# Patient Record
Sex: Female | Born: 1975
Health system: Southern US, Community
[De-identification: ages and names within clinical notes are randomized; demographics above are authoritative.]

## PROBLEM LIST (undated history)

## (undated) DIAGNOSIS — O24419 Gestational diabetes mellitus in pregnancy, unspecified control: Secondary | ICD-10-CM

## (undated) DIAGNOSIS — O1490 Unspecified pre-eclampsia, unspecified trimester: Secondary | ICD-10-CM

## (undated) DIAGNOSIS — R011 Cardiac murmur, unspecified: Secondary | ICD-10-CM

## (undated) HISTORY — DX: Gestational diabetes mellitus in pregnancy, unspecified control: O24.419

---

## 1999-10-14 ENCOUNTER — Other Ambulatory Visit: Admission: RE | Admit: 1999-10-14 | Discharge: 1999-10-14 | Payer: Self-pay | Admitting: Gynecology

## 1999-12-02 ENCOUNTER — Other Ambulatory Visit: Admission: RE | Admit: 1999-12-02 | Discharge: 1999-12-02 | Payer: Self-pay | Admitting: Gynecology

## 1999-12-02 ENCOUNTER — Encounter (INDEPENDENT_AMBULATORY_CARE_PROVIDER_SITE_OTHER): Payer: Self-pay

## 2000-03-14 ENCOUNTER — Other Ambulatory Visit: Admission: RE | Admit: 2000-03-14 | Discharge: 2000-03-14 | Payer: Self-pay | Admitting: Gynecology

## 2001-01-16 ENCOUNTER — Other Ambulatory Visit: Admission: RE | Admit: 2001-01-16 | Discharge: 2001-01-16 | Payer: Self-pay | Admitting: Gynecology

## 2001-08-28 ENCOUNTER — Other Ambulatory Visit: Admission: RE | Admit: 2001-08-28 | Discharge: 2001-08-28 | Payer: Self-pay | Admitting: Gynecology

## 2002-08-29 ENCOUNTER — Other Ambulatory Visit: Admission: RE | Admit: 2002-08-29 | Discharge: 2002-08-29 | Payer: Self-pay | Admitting: Gynecology

## 2004-05-21 ENCOUNTER — Other Ambulatory Visit: Admission: RE | Admit: 2004-05-21 | Discharge: 2004-05-21 | Payer: Self-pay | Admitting: Gynecology

## 2005-06-16 ENCOUNTER — Other Ambulatory Visit: Admission: RE | Admit: 2005-06-16 | Discharge: 2005-06-16 | Payer: Self-pay | Admitting: Gynecology

## 2006-04-08 ENCOUNTER — Inpatient Hospital Stay (HOSPITAL_COMMUNITY): Admission: AD | Admit: 2006-04-08 | Discharge: 2006-04-08 | Payer: Self-pay | Admitting: Obstetrics & Gynecology

## 2006-04-10 ENCOUNTER — Inpatient Hospital Stay (HOSPITAL_COMMUNITY): Admission: AD | Admit: 2006-04-10 | Discharge: 2006-04-15 | Payer: Self-pay | Admitting: Obstetrics & Gynecology

## 2008-12-29 ENCOUNTER — Emergency Department (HOSPITAL_COMMUNITY): Admission: EM | Admit: 2008-12-29 | Discharge: 2008-12-29 | Payer: Self-pay | Admitting: Family Medicine

## 2009-11-03 ENCOUNTER — Ambulatory Visit: Payer: Self-pay | Admitting: Family Medicine

## 2009-11-03 DIAGNOSIS — B309 Viral conjunctivitis, unspecified: Secondary | ICD-10-CM | POA: Insufficient documentation

## 2009-11-03 DIAGNOSIS — J069 Acute upper respiratory infection, unspecified: Secondary | ICD-10-CM | POA: Insufficient documentation

## 2010-05-20 ENCOUNTER — Ambulatory Visit: Payer: Self-pay | Admitting: Emergency Medicine

## 2010-09-09 NOTE — Assessment & Plan Note (Signed)
Summary: LEFT EYE POSSIBLE PINK EYE   Vital Signs:  Patient Profile:   35 Years Old Female CC:      possible pink eye in Left eye X 1 day Height:     68 inches Weight:      192 pounds O2 Sat:      99 % O2 treatment:    Room Air Temp:     98.0 degrees F oral Pulse rate:   75 / minute Resp:     16 per minute BP sitting:   115 / 73  (right arm) Cuff size:   regular  Pt. in pain?   yes    Location:   left eye    Type:       heaviness  Vitals Entered By: Lajean Saver RN (November 03, 2009 10:08 AM)                   Updated Prior Medication List: VITAMIN D 400 UNIT CAPS (CHOLECALCIFEROL) once daily  Current Allergies: ! * SEASONALHistory of Present Illness Chief Complaint: possible pink eye in Left eye X 1 day History of Present Illness: Subjective: Patient complains of mild URI symptoms that started 4 days ago.  Last night she developed mild redness and itching of left eye.  She felt slight irritation in right eye this morning. + mild sore throat + mild cough No pleuritic pain No wheezing + nasal congestion No post-nasal drainage No sinus pain/pressure No earache No hemoptysis No SOB No fever/chills No nausea No vomiting No abdominal pain No diarrhea No skin rashes No fatigue No myalgias No headache    REVIEW OF SYSTEMS Constitutional Symptoms      Denies fever, chills, night sweats, weight loss, weight gain, and fatigue.  Eyes       Complains of eye pain and eye drainage.      Denies change in vision, glasses, contact lenses, and eye surgery.      Comments: left eye Ear/Nose/Throat/Mouth       Denies hearing loss/aids, change in hearing, ear pain, ear discharge, dizziness, frequent runny nose, frequent nose bleeds, sinus problems, sore throat, hoarseness, and tooth pain or bleeding.  Respiratory       Denies dry cough, productive cough, wheezing, shortness of breath, asthma, bronchitis, and emphysema/COPD.  Cardiovascular       Denies murmurs, chest  pain, and tires easily with exhertion.    Gastrointestinal       Denies stomach pain, nausea/vomiting, diarrhea, constipation, blood in bowel movements, and indigestion. Genitourniary       Denies painful urination, kidney stones, and loss of urinary control. Neurological       Denies paralysis, seizures, and fainting/blackouts. Musculoskeletal       Denies muscle pain, joint pain, joint stiffness, decreased range of motion, redness, swelling, muscle weakness, and gout.  Skin       Denies bruising, unusual mles/lumps or sores, and hair/skin or nail changes.  Psych       Denies mood changes, temper/anger issues, anxiety/stress, speech problems, depression, and sleep problems.  Past History:  Past Medical History: Unremarkable  Past Surgical History: Caesarean section 2007  Family History: Mother- MI Father- Heart failure  Social History: Married Never Smoked Alcohol use-no Drug use-no Smoking Status:  never Drug Use:  no   Objective:  No acute distress  Eyes:  Pupils are equal, round, and reactive to light and accomdation.  Extraocular movement is intact.   Left conjunctivae minimally injected.  No  discharge.  No lid tenderness or swelling.  No photophobia. Ears:  Canals normal.  Tympanic membranes normal.   Nose:  Normal septum.  Normal turbinates, mildly congested.  No sinus tenderness present.  Pharynx:  Normal  Neck:  Supple.  No adenopathy is present.   Lungs:  Clear to auscultation.  Breath sounds are equal.  Heart:  Regular rate and rhythm without murmurs, rubs, or gallops.  Abdomen:  Nontender without masses or hepatosplenomegaly.  Bowel sounds are present.  No CVA or flank tenderness.  Skin:  No rash Assessment New Problems: CONJUNCTIVITIS, VIRAL, LEFT (ICD-077.99) URI (ICD-465.9)  VIRAL URI WITH VIRAL CONJUNCTIVITIS  Plan New Medications/Changes: SULFACETAMIDE SODIUM 10 % SOLN (SULFACETAMIDE SODIUM) 1 or 2 gtts in affected eye q2 to 3 hr  #10cc x 0,  11/03/2009, Donna Christen MD  New Orders: New Patient Level III [id number] Planning Comments:   Treat symptomatically for now:  Use cool lubricating eye drops several times daily ("Refresh Tears") Begin expectorant/decongestant, cough suppressant at night.  If eye becomes increasingly red/irritated and persists, add sulfacetamide eye drops. Follow-up with PCP if not improving one week.   The patient and/or caregiver has been counseled thoroughly with regard to medications prescribed including dosage, schedule, interactions, rationale for use, and possible side effects and they verbalize understanding.  Diagnoses and expected course of recovery discussed and will return if not improved as expected or if the condition worsens. Patient and/or caregiver verbalized understanding.  Prescriptions: SULFACETAMIDE SODIUM 10 % SOLN (SULFACETAMIDE SODIUM) 1 or 2 gtts in affected eye q2 to 3 hr  #10cc x 0   Entered and Authorized by:   Donna Christen MD   Signed by:   Donna Christen MD on 11/03/2009   Method used:   Print then Give to Patient   RxID:   [id number]   Patient Instructions: 1)  May use Mucinex D (guaifenesin with decongestant) twice daily for congestion. 2)  Increase fluid intake, rest. 3)  May use Afrin nasal spray (or generic oxymetazoline) twice daily for about 5 days.  Also recommend using saline nasal spray several times daily and/or saline nasal irrigation. 4)  May use Delsym at night for cough. 5)  Use cool Refresh Tears in eyes several times daily. 6)  If eye redness does not improve in about 5 days, begin antibiotic eye drops 7)  Followup with family doctor if not improving one week.

## 2010-09-09 NOTE — Letter (Signed)
Summary: Out of Work  [facility]  [address]   [place], [place] [address]   Phone: [phone]  Fax: [phone]    November 03, 2009   Employee:  [name]    To Whom It May Concern:   For Medical reasons, please excuse the above named employee from work today.    If you need additional information, please feel free to contact our office.         Sincerely,    Donna Christen MD

## 2010-09-09 NOTE — Assessment & Plan Note (Signed)
Summary: PINK EYE   Vital Signs:  Patient Profile:   35 Years Old Female CC:      ?conjuctivitis both eyes Height:     68 inches Weight:      193 pounds O2 Sat:      99 % O2 treatment:    Room Air Temp:     98.0 degrees F oral Pulse rate:   90 / minute Resp:     14 per minute BP sitting:   111 / 70  (left arm) Cuff size:   regular  Vitals Entered By: Lajean Saver RN (May 20, 2010 10:10 AM)                  Updated Prior Medication List: VITAMIN D 400 UNIT CAPS (CHOLECALCIFEROL) once daily FOLIC ACID 1 MG TABS (FOLIC ACID)  * MULTIVITAMIN once daily  Current Allergies (reviewed today): ! * SEASONALHistory of Present Illness Chief Complaint: ?conjuctivitis both eyes History of Present Illness: Pink eye R eye.  Her daughter was dx yesterday and started on Vigamox.  She woke up today with crusts, redness, irritation.  Normal vision.  No F/C.  No URI symptoms.  Not using any OTC meds.  She doesn't wear contacts or glasses. They are trying to get pregnant and wants to be careful with meds.  She is also a Engineer, civil (consulting) at [employer].  REVIEW OF SYSTEMS Constitutional Symptoms      Denies fever, chills, night sweats, weight loss, weight gain, and fatigue.  Eyes       Complains of eye pain and eye drainage.      Denies change in vision, glasses, contact lenses, and eye surgery.      Comments: x last night Ear/Nose/Throat/Mouth       Denies hearing loss/aids, change in hearing, ear pain, ear discharge, dizziness, frequent runny nose, frequent nose bleeds, sinus problems, sore throat, hoarseness, and tooth pain or bleeding.  Respiratory       Denies dry cough, productive cough, wheezing, shortness of breath, asthma, bronchitis, and emphysema/COPD.  Cardiovascular       Denies murmurs, chest pain, and tires easily with exhertion.    Gastrointestinal       Denies stomach pain, nausea/vomiting, diarrhea, constipation, blood in bowel movements, and indigestion. Genitourniary       Denies  painful urination, kidney stones, and loss of urinary control. Neurological       Denies paralysis, seizures, and fainting/blackouts. Musculoskeletal       Denies muscle pain, joint pain, joint stiffness, decreased range of motion, redness, swelling, muscle weakness, and gout.  Skin       Denies bruising, unusual mles/lumps or sores, and hair/skin or nail changes.  Psych       Denies mood changes, temper/anger issues, anxiety/stress, speech problems, depression, and sleep problems. Other Comments: Daughter diagnosed with conjuctivitis yesterday   Past History:  Past Medical History: Reviewed history from 11/03/2009 and no changes required. Unremarkable  Past Surgical History: Reviewed history from 11/03/2009 and no changes required. Caesarean section 2007  Family History: Reviewed history from 11/03/2009 and no changes required. Mother- MI Father- Heart failure  Social History: Reviewed history from 11/03/2009 and no changes required. Married Never Smoked Alcohol use-no Drug use-no Physical Exam General appearance: well developed, well nourished, no acute distress Ears: normal, no lesions or deformities Nasal: mucosa pink, nonedematous, no septal deviation, turbinates normal Chest/Lungs: no rales, wheezes, or rhonchi bilateral, breath sounds equal without effort Heart: regular rate and  rhythm,  no murmur R eye: mild crust, mild conj erythema, sclera normal L eye: normal Assessment New Problems: CONJUNCTIVITIS (ICD-372.30)   Patient Education: Patient and/or caregiver instructed in the following: rest, fluids, Ibuprofen prn.  Plan New Medications/Changes: TOBREX 0.3 % SOLN (TOBRAMYCIN SULFATE) 2 drops both eyes three times a day for 7 days  #1 bottle x 0, 05/20/2010, Hoyt Koch MD TOBREX 0.3 % SOLN (TOBRAMYCIN SULFATE) 3 drops R eye for 7 days  #1 bottle x 0, 05/20/2010, Hoyt Koch MD  New Orders: Est. Patient Level II [id number] Planning Comments:    Wash hands frequently, change pillowcases daily Use eyedrops (these are class B for pregnancy which is the best in terms of antibacterial eyedrops) If not getting better, f/u with PCP or ophthalmology   The patient and/or caregiver has been counseled thoroughly with regard to medications prescribed including dosage, schedule, interactions, rationale for use, and possible side effects and they verbalize understanding.  Diagnoses and expected course of recovery discussed and will return if not improved as expected or if the condition worsens. Patient and/or caregiver verbalized understanding.  Prescriptions: TOBREX 0.3 % SOLN (TOBRAMYCIN SULFATE) 2 drops both eyes three times a day for 7 days  #1 bottle x 0   Entered and Authorized by:   Hoyt Koch MD   Signed by:   Hoyt Koch MD on 05/20/2010   Method used:   Print then Give to Patient   RxID:   [id number] TOBREX 0.3 % SOLN (TOBRAMYCIN SULFATE) 3 drops R eye for 7 days  #1 bottle x 0   Entered and Authorized by:   Hoyt Koch MD   Signed by:   Hoyt Koch MD on 05/20/2010   Method used:   Print then Give to Patient   RxID:   [id number]   Orders Added: 1)  Est. Patient Level II [29528]

## 2010-11-24 ENCOUNTER — Ambulatory Visit (INDEPENDENT_AMBULATORY_CARE_PROVIDER_SITE_OTHER): Payer: 59 | Admitting: Cardiology

## 2010-11-24 ENCOUNTER — Encounter: Payer: Self-pay | Admitting: Cardiology

## 2010-11-24 DIAGNOSIS — R0600 Dyspnea, unspecified: Secondary | ICD-10-CM

## 2010-11-24 DIAGNOSIS — R011 Cardiac murmur, unspecified: Secondary | ICD-10-CM

## 2010-11-24 DIAGNOSIS — R002 Palpitations: Secondary | ICD-10-CM | POA: Insufficient documentation

## 2010-11-24 DIAGNOSIS — R0609 Other forms of dyspnea: Secondary | ICD-10-CM

## 2010-11-24 LAB — MAGNESIUM: Magnesium: 2 mg/dL (ref 1.5–2.5)

## 2010-11-24 LAB — BASIC METABOLIC PANEL
GFR: 158.25 mL/min (ref >60.00)
Glucose, Bld: 89 mg/dL (ref 70–99)
Potassium: 3.9 mEq/L (ref 3.5–5.1)
Sodium: 138 mEq/L (ref 135–145)

## 2010-11-24 NOTE — Assessment & Plan Note (Signed)
Most likely a flow murmur. Await echocardiogram.

## 2010-11-24 NOTE — Assessment & Plan Note (Signed)
Description most consistent with PVCs. They do not appear to be particularly bothersome at this point. If they worsen we will consider a CardioNet monitor. We could add a beta blocker in the future if needed but will avoid at present given the paucity of symptoms and ongoing pregnancy. Check potassium and magnesium. Echocardiogram to quantify LV function.

## 2010-11-24 NOTE — Progress Notes (Signed)
HPI:  35 yo female with no prior cardiac history for evaluation of palpitations and murmur. Note patient is [redacted] weeks pregnant. Over the past one week she has had occasional palpitations. They are described as a skip. They are not sustained. She also has some dyspnea on exertion which she attributes to her pregnancy. Question recent orthopnea. No PND. Pedal edema attributed to pregnancy. No chest pain or history of syncope. Patient also noted to have a murmur by her gynecologist. Cardiology is asked to further evaluate. Current Outpatient Prescriptions  Medication Sig Dispense Refill  . aspirin 81 MG tablet Take 81 mg by mouth daily.        . Cholecalciferol (VITAMIN D) 1000 UNITS capsule Take 1,000 Units by mouth daily.        . Prenatal MV-Min-Fe Fum-FA-DHA (PRENATAL 1 PO) Take by mouth. 1 tab po qd         No Known Allergies  No past medical history on file.  Past Surgical History  Procedure Date  . Cesarean section     History   Social History  . Marital Status: Married    Spouse Name: N/A    Number of Children: 1  . Years of Education: N/A   Occupational History  .  [employer]   Social History Main Topics  . Smoking status: Never Smoker   . Smokeless tobacco: Not on file  . Alcohol Use: Yes     Occasional; none while pregnant  . Drug Use: No  . Sexually Active: Not on file   Other Topics Concern  . Not on file   Social History Narrative  . No narrative on file    Family History  Problem Relation Age of Onset  . Sudden death Mother     Age 28; ? MI  . Coronary artery disease Father     Age 32    ROS: no fevers or chills, productive cough, hemoptysis, dysphasia, odynophagia, melena, hematochezia, dysuria, hematuria, rash, seizure activity, orthopnea, PND, pedal edema, claudication. Remaining systems are negative.  Physical Exam: General:  Well developed/well nourished in NAD Skin warm/dry Patient not depressed No peripheral  clubbing Back-normal HEENT-normal/normal eyelids Neck supple/normal carotid upstroke bilaterally; no bruits; no JVD; no thyromegaly chest - CTA/ normal expansion CV - RRR/normal S1 and S2; no rubs or gallops;  PMI nondisplaced; 2/6 systolic ejection murmur Abdomen -NT/ND, no HSM, + bowel sounds, no bruit; [redacted] weeks pregnant 2+ femoral pulses, no bruits Ext-1+ edema, no chords, 2+ DP Neuro-grossly nonfocal  ECG NSR, Normal axis, no ST changes.

## 2010-11-24 NOTE — Assessment & Plan Note (Signed)
Most likely related to pregnancy. Echocardiogram to quantify LV function.

## 2010-11-24 NOTE — Patient Instructions (Addendum)
Your physician has requested that you have an echocardiogram. Echocardiography is a painless test that uses sound waves to create images of your heart. It provides your doctor with information about the size and shape of your heart and how well your heart's chambers and valves are working. This procedure takes approximately one hour. There are no restrictions for this procedure. Friday 11-26-10 @ 8:30AM   Your physician recommends that you return for lab work in: TODAY

## 2010-11-26 ENCOUNTER — Ambulatory Visit (HOSPITAL_COMMUNITY): Payer: 59 | Attending: Cardiology | Admitting: Radiology

## 2010-11-26 DIAGNOSIS — I2510 Atherosclerotic heart disease of native coronary artery without angina pectoris: Secondary | ICD-10-CM | POA: Insufficient documentation

## 2010-11-26 DIAGNOSIS — R002 Palpitations: Secondary | ICD-10-CM | POA: Insufficient documentation

## 2010-11-26 DIAGNOSIS — R011 Cardiac murmur, unspecified: Secondary | ICD-10-CM | POA: Insufficient documentation

## 2010-11-26 DIAGNOSIS — I079 Rheumatic tricuspid valve disease, unspecified: Secondary | ICD-10-CM | POA: Insufficient documentation

## 2010-11-26 DIAGNOSIS — R0609 Other forms of dyspnea: Secondary | ICD-10-CM | POA: Insufficient documentation

## 2010-11-26 DIAGNOSIS — R0600 Dyspnea, unspecified: Secondary | ICD-10-CM

## 2010-11-26 DIAGNOSIS — I059 Rheumatic mitral valve disease, unspecified: Secondary | ICD-10-CM | POA: Insufficient documentation

## 2010-11-26 DIAGNOSIS — R0989 Other specified symptoms and signs involving the circulatory and respiratory systems: Secondary | ICD-10-CM | POA: Insufficient documentation

## 2010-12-01 ENCOUNTER — Telehealth: Payer: Self-pay | Admitting: Cardiology

## 2010-12-01 NOTE — Telephone Encounter (Signed)
Pt returning your call re echo test results.

## 2010-12-01 NOTE — Telephone Encounter (Signed)
Spoke with pt, she is aware of echo results [name]  

## 2010-12-19 ENCOUNTER — Inpatient Hospital Stay (HOSPITAL_COMMUNITY)
Admission: AD | Admit: 2010-12-19 | Discharge: 2010-12-19 | Disposition: A | Discharge status: Home / Self Care | Payer: 59 | Source: Ambulatory Visit | Attending: Obstetrics & Gynecology | Admitting: Obstetrics & Gynecology

## 2010-12-19 DIAGNOSIS — O368190 Decreased fetal movements, unspecified trimester, not applicable or unspecified: Secondary | ICD-10-CM | POA: Insufficient documentation

## 2010-12-19 LAB — URINALYSIS, ROUTINE W REFLEX MICROSCOPIC
Hgb urine dipstick: NEGATIVE
Nitrite: NEGATIVE
Specific Gravity, Urine: 1.015 (ref 1.005–1.030)
Urobilinogen, UA: 0.2 mg/dL (ref 0.0–1.0)

## 2010-12-24 NOTE — Op Note (Signed)
Julie [name], [name]                 ACCOUNT NO.:  [id number]   MEDICAL RECORD NO.:  [id number]          PATIENT TYPE:  INP   LOCATION:  9111                          FACILITY:  [facility]   PHYSICIAN:  Richardean Sale, M.D.   DATE OF BIRTH:  1976-07-06   DATE OF PROCEDURE:  04/12/2006  DATE OF DISCHARGE:                                 OPERATIVE REPORT   PREOPERATIVE DIAGNOSES:  1. 38 week pregnancy with mild preeclampsia for induction.  2. Arrest of dilation.   POSTOPERATIVE DIAGNOSES:  1. 38 week pregnancy with mild preeclampsia for induction.  2. Arrest of dilation.   PROCEDURE:  Primary low transverse cesarean section.   SURGEON:  Dr. Richardean Sale.   ASSISTANT:  None.   COMPLICATIONS:  None.   ANESTHESIA:  Epidural.   SPECIMENS:  Placenta to pathology.   OPERATIVE FINDINGS:  Viable female infant in the cephalic presentation,  right occiput transverse nuchal cord x1, Apgar 9 and 9, arterial cord pH  7.31.  Normal-appearing adnexa. Estimated birth weight 8 pounds 9 ounces.   INDICATIONS:  This is a 30-year, gravida 1, para 0, white female who is  admitted at [redacted] weeks gestation for induction of labor due to preeclampsia.  The patient had 1 gram of protein and a 24-hour urine specimen and has had  labile blood pressures in the office. The patient underwent induction of  labor with Cervidil followed by amniotomy and administration of Pitocin.  The patient was on a maximum dose Pitocin and reached maximal dilation of 4  cm which was unchanged over 5 hours. The patient's labor which initially  showed contractions every 2-3 minutes ultimately spaced out with  contractions only every 5 minutes despite administration of Pitocin. Given  the arrest of dilation, the patient was counseled on recommendation to  proceed with primary cesarean delivery.  Prior to proceeding, the risks,  benefits and alternatives of the procedure discussed with the patient in  detail.  We discussed the risks  which include but are not limited to  hemorrhage requiring transfusion, infection, injury to the bowel, the  bladder or other organs which could require additional surgery.  The risks  of deep vein thrombosis and anesthesia related complications.  The patient  voiced an understanding of the above and desires to proceed. Informed  consent was obtained before proceeding to the OR.   DESCRIPTION OF PROCEDURE:  The patient was taken to the operating room where  her epidural was dosed and was tested and was adequate.  She was then  prepped and draped in the usual sterile fashion with Betadine.  A Foley  catheter had already been placed. A Pfannenstiel skin incision was made with  the scalpel.  Prior to making the incision, 10 mL of 0.5% plain Marcaine  were injected in the area where the incision was to be made. The incision  was then carried down to the fascia, the fascia was incised in the midline  and the fascial incision was then extended laterally with the Mayo scissors.  The superior and inferior aspects of the fascial incision were  then grasped  with Kocher clamps, elevated and the underlying rectus muscles dissected off  with both sharp and blunt dissection.  The muscles was then opened in the  midline.  The peritoneum was identified and entered bluntly.  The peritoneal  incision was then extended superiorly and inferiorly with good visualization  of the bladder.  The bladder blade was then inserted and the vesicouterine  peritoneum was grasped with pickups and entered sharply with Metzenbaum  scissors.  This incision was then extended laterally and a bladder flap was  created digitally.   The bladder blade was then reinserted and the lower uterine segment was  incised in a transverse fashion with a scalpel.  This incision was then  extended manually, a hand was then placed in the incision and infant's head  was delivered atraumatically.  Nuchal cord x1 was reduced and nose and mouth   were suctioned with the bulb. The infant was then delivered to the sterile  field.  The cord was clamped and cut and the infant handed off to waiting  NICU attendant.  Cord gas and cord blood was obtained.  Arterial cord pH was  7.31.  Apgar's were 9 and 9. The placenta was then removed, the uterus was  cleared of all clot and debris and the placenta was sent to labor and  delivery. A moderate amount of bleeding was controlled with intravenous  Pitocin and bimanual massage. The uterus incision was then closed with a  running locked chromic suture.  A second layer of same suture was placed in  an imbricating fashion for additional hemostasis and in the event this  patient would attempt at vaginal delivery in the future. Once the uterine  incision was hemostatic, the adnexa were visualized and were normal.  The  peritoneal surfaces, bladder flap and muscle surfaces were all inspected and  any areas of bleeding were cauterized with the Bovie until hemostasis was  assured.  The fascia was then closed with a running Vicryl suture and  subcutaneous space was irrigated.  Any areas of bleeding were cauterized  with the Bovie and the skin was then closed with staples.   The patient tolerated the procedure very well.  All sponge, lap, needle and  instrument counts were correct x2.  She was taken to the recovery room awake  in stable condition and there were no complications.      Richardean Sale, M.D.  Electronically Signed     JW/MEDQ  D:  04/12/2006  T:  04/12/2006  Job:  [id number]

## 2010-12-24 NOTE — Discharge Summary (Signed)
[name], [name]                 ACCOUNT NO.:  [id number]   MEDICAL RECORD NO.:  [id number]          PATIENT TYPE:  INP   LOCATION:  9111                          FACILITY:  [facility]   PHYSICIAN:  Richardean Sale, M.D.   DATE OF BIRTH:  03/21/1976   DATE OF ADMISSION:  04/10/2006  DATE OF DISCHARGE:  04/15/2006                                 DISCHARGE SUMMARY   ADMISSION DIAGNOSIS:  Intrauterine pregnancy at 38 weeks with mild  preeclampsia for induction of labor.   DISCHARGE DIAGNOSES:  1. Intrauterine pregnancy at 38 weeks with mild preeclampsia for induction      of labor.  2. Arrest of dilation.  3. Status post cesarean section delivery.   OPERATION/PROCEDURE:  Primary low transverse cesarean delivery performed on  April 12, 2006, resulting in delivery of a viable infant with Apgars of 8  and 9, intact placenta with three-vessel cord, normal adnexa, and normal  uterus.   HOSPITAL COURSE AND HISTORY OF PRESENT ILLNESS:  Please see admission  history and physical for details.  Briefly, this is a 35 year old, gravida  1, para 0 white female who is at [redacted] weeks gestation with mild preeclampsia  based on proteinuria of 1 g on a 24-hour urine specimen within the last week  and mild elevated blood pressures in the 130s/80s.  The patient denied any  headache, visual changes or epigastric pain and her preeclampsia labs have  been within normal limits.  The patient presented for cervical ripening on  April 10, 2006, underwent ripening with Cervidil followed by  administration of Pitocin and amniotomy.  The patient reached maximum  dilation of 4 cm despite intravenous Pitocin and subsequently underwent a  primary cesarean delivery for arrest of dilation.   The patient's postoperative course was unremarkable.  She remains afebrile  throughout her hospitalizations. Her vital signs remained stable and her  blood pressures remained in the 130s/80s.  She denied any headache, visual  change or epigastric pain.  On postoperative day #3, she was ambulating well  without difficulty.  Was tolerating regular diet.  Her pain was controlled  with oral pain medication.  She was subsequent discharged to home on  postoperative day #3 in good condition.   DISPOSITION:  To home.   CONDITION ON DISCHARGE:  Improved.   FOLLOW UP:  The patient will follow up in four weeks for routine  postoperative visit.   LABORATORY DATA:  Postoperative hemoglobin 10.8, hematocrit 30.7, platelets  185, white count 12.  Day of admission AST 20, ALT 11, creatinine 0.8, LDH  155, uric acid 5.9.   MEDICATIONS:  1. Percocet one to two tablets p.o. q.4-6h. p.r.n. pain.  2. Motrin 600 mg p.o. q.6 h. p.r.n.   DISCHARGE INSTRUCTIONS:  The patient is to call for any headache, visual  changes or epigastric pain, any pain not relieved with her pain medication,  vomiting, heavy vaginal bleeding, redness or drainage from incision or any  temperature greater than 0.4.      Richardean Sale, M.D.  Electronically Signed     JW/MEDQ  D:  04/16/2006  T:  04/17/2006  Job:  [id number]

## 2011-01-31 ENCOUNTER — Encounter (HOSPITAL_COMMUNITY)
Admission: RE | Admit: 2011-01-31 | Discharge: 2011-01-31 | Disposition: A | Discharge status: Home / Self Care | Payer: 59 | Source: Ambulatory Visit | Attending: Obstetrics | Admitting: Obstetrics

## 2011-01-31 LAB — CBC
Hemoglobin: 12.4 g/dL (ref 12.0–15.0)
MCH: 32 pg (ref 26.0–34.0)
MCV: 92.8 fL (ref 78.0–100.0)
RBC: 3.88 MIL/uL (ref 3.87–5.11)

## 2011-01-31 LAB — BASIC METABOLIC PANEL
CO2: 23 mEq/L (ref 19–32)
Calcium: 9.1 mg/dL (ref 8.4–10.5)
Chloride: 103 mEq/L (ref 96–112)
Creatinine, Ser: 0.52 mg/dL (ref 0.50–1.10)
Glucose, Bld: 123 mg/dL — ABNORMAL HIGH (ref 70–99)
Sodium: 135 mEq/L (ref 135–145)

## 2011-01-31 LAB — SURGICAL PCR SCREEN: Staphylococcus aureus: NEGATIVE

## 2011-02-07 ENCOUNTER — Inpatient Hospital Stay (HOSPITAL_COMMUNITY)
Admission: RE | Admit: 2011-02-07 | Discharge: 2011-02-10 | DRG: 765 | Disposition: A | Discharge status: Home / Self Care | Payer: 59 | Source: Ambulatory Visit | Attending: Obstetrics | Admitting: Obstetrics

## 2011-02-07 ENCOUNTER — Other Ambulatory Visit: Payer: Self-pay | Admitting: Obstetrics

## 2011-02-07 DIAGNOSIS — Z01818 Encounter for other preprocedural examination: Secondary | ICD-10-CM

## 2011-02-07 DIAGNOSIS — O09529 Supervision of elderly multigravida, unspecified trimester: Secondary | ICD-10-CM | POA: Diagnosis present

## 2011-02-07 DIAGNOSIS — O34219 Maternal care for unspecified type scar from previous cesarean delivery: Secondary | ICD-10-CM | POA: Diagnosis present

## 2011-02-07 DIAGNOSIS — O3660X0 Maternal care for excessive fetal growth, unspecified trimester, not applicable or unspecified: Principal | ICD-10-CM | POA: Diagnosis present

## 2011-02-07 DIAGNOSIS — O99814 Abnormal glucose complicating childbirth: Secondary | ICD-10-CM | POA: Diagnosis present

## 2011-02-07 DIAGNOSIS — Z01812 Encounter for preprocedural laboratory examination: Secondary | ICD-10-CM

## 2011-02-07 DIAGNOSIS — O409XX0 Polyhydramnios, unspecified trimester, not applicable or unspecified: Secondary | ICD-10-CM | POA: Diagnosis present

## 2011-02-07 LAB — TYPE AND SCREEN: Antibody Screen: NEGATIVE

## 2011-02-07 LAB — GLUCOSE, CAPILLARY

## 2011-02-08 LAB — CBC
HCT: 30.2 % — ABNORMAL LOW (ref 36.0–46.0)
MCV: 93.2 fL (ref 78.0–100.0)
Platelets: 134 10*3/uL — ABNORMAL LOW (ref 150–400)
RBC: 3.24 MIL/uL — ABNORMAL LOW (ref 3.87–5.11)
WBC: 11.7 10*3/uL — ABNORMAL HIGH (ref 4.0–10.5)

## 2011-02-16 NOTE — Op Note (Signed)
[name], [name]                 ACCOUNT NO.:  [id number]  MEDICAL RECORD NO.:  [id number]  LOCATION:                                 FACILITY:  PHYSICIAN:  Lendon Colonel, MD   DATE OF BIRTH:  09-15-1975  DATE OF PROCEDURE:  02/07/2011 DATE OF DISCHARGE:                              OPERATIVE REPORT   PREOPERATIVE DIAGNOSES:  Macrosomia, gestational diabetes, polyhydramnios, prior cesarean section.  POSTOPERATIVE DIAGNOSES:  Macrosomia, gestational diabetes, polyhydramnios, prior cesarean section.  PROCEDURE:  Repeat cesarean section.  SURGEON:  Lendon Colonel, MD  ASSISTANT:  Ivonne Andrew, PA  ANESTHESIA:  Spinal.  FINDINGS:  Female infant in the DOA position, Apgar's 9 and 9, weight 12 pounds 3 ounces.  Cord pH 7.33.  Normal uterus, tubes, and ovaries. Large amount of amniotic fluid, clear, and intact placenta to Pathology and about 1 gram of Ancef.  ESTIMATED BLOOD LOSS:  1200 mL.  COMPLICATIONS:  None.  INDICATIONS:  This is a 34 year old multiparous patient with prior cesarean section and macrosomic baby who opted for elective repeat cesarean section at 39 weeks.  PROCEDURE:  After informed consent was obtained from the patient including risks, benefits of the surgical procedure increased risks of bleeding due to uterine distention from polyhydramnios and macrosomia and increased surgical risks due to gestational diabetes.  The patient was taken to the operating room where spinal anesthesia was initiated without difficulty.  She was prepped and draped in normal sterile fashion in the dorsal spine position with leftward tilt.  A Pfannenstiel skin incision was made 2 cm above the pubic symphysis in the midline, carried through to the underlying layer of fascia with the Bovie cautery.  The fascia was incised in midline.  The incision was extended laterally with the Mayo scissors.  The inferior aspect of the fascial incision was grasped with Kocher clamps,  elevated up, and the underlying rectus muscle dissected off sharply.  The superior aspect of the fascial incision was grasped with Kocher clamps, elevated up, and the underlying rectus muscles dissected off sharply.  The midline was identified and the peritoneum was scarred to the underside of fascia.  It was entered and cautious dissection was done to remove the peritoneal scar from the fascial incision.  The bladder was identified and peritoneal incision extended laterally to keep adequate room and avoid extension into the bladder.  The bladder blade was inserted.  The peritoneum was identified, grasped with pickups, entered sharply with the Metzenbaum scissors.  The incision was extended laterally and the bladder flap was created digitally.  Bladder blade was reinserted.  The lower uterine segment was incised in a transverse fashion with scalpel, extended superiorly and laterally.  The amniotic sac was then ruptured. Instruments removed.  The vertex was rotated, elevated, brought to the incision, and delivered with fundal pressure.  No nuchal cord was noted. The nose and mouth were bulb suctioned.  The remainder of the infant delivered without complications.  The cord was clamped and cut.  Infant was handed off to awaiting pediatrician.  Cord gas and cord blood were obtained.  The placenta was expressed.  The uterus was cleared of all  clots and debris.  The bladder blade was reinserted.  The edge of the incision were tagged.  The uterine incision was closed with 0 Vicryl in a running lock fashion.  Several additional figure-of-eight sutures were placed along the right angle of the incision to obtain hemostasis.  The second layer of 0 Vicryl was used in imbricating fashion for hemostasis and several additional figure-of-eight sutures were placed along the incision.  Good hemostasis was noted.  The tubes and ovaries were identified and appeared normal.  The incision was reinspected, found  to be hemostatic.  The pelvis was irrigated with warm normal saline.  The peritoneum was closed with 2-0 Vicryl after the tagged edges were cut on the side of the fascia and underside of the muscles were inspected, found to be hemostatic.  The fascia was closed with 0 Vicryl in a running fashion in 2 halves.  Scarpa's layer was closed with 2-0 plain gut and the skin was closed with staples.  The patient tolerated the procedure well.  Sponge, lap, and needle counts were correct x3 and the patient was taken to the recovery room in stable condition.     Lendon Colonel, MD     KAF/MEDQ  D:  02/07/2011  T:  02/08/2011  Job:  [id number]  Electronically Signed by Noland Fordyce MD on 02/16/2011 11:26:34 PM

## 2011-02-16 NOTE — Discharge Summary (Signed)
NAMEVIVIENNE, [name]                 ACCOUNT NO.:  [id number]  MEDICAL RECORD NO.:  [id number]  LOCATION:  9133                          FACILITY:  [facility]  PHYSICIAN:  Lendon Colonel, MD   DATE OF BIRTH:  07/04/1976  DATE OF ADMISSION:  02/07/2011 DATE OF DISCHARGE:  02/10/2011                              DISCHARGE SUMMARY   ADMITTING DIAGNOSES:  Intrauterine pregnancy at term for repeat cesarean section, macrosomia.  DISCHARGE DIAGNOSIS:  Postoperative day #3, stable, dependent edema.  HISTORY:  The patient is a gravida 2, para 1-0-0-1 at 30 weeks' gestation with [facility] February 14, 2011.  Prenatal care was obtained at [facility] since 8 weeks and 2 days' gestation with Dr. Ernestina [name] as primary.  PRENATAL LABORATORY DATA:  O positive, rubella immune, GBS negative, HIV negative, RPR nonreactive, hepatitis B negative, and 1-hour GTT 130.  Prenatal course was complicated by macrosomia, history of preeclampsia at 37 weeks with induction of labor in the first pregnancy, maintained on baby aspirin until 36 weeks during this pregnancy; prior cesarean section; macrosomia; gestational diabetic with initially passed glucose tolerance test, but given size greater than dates persistent and blood sugars checked periodically, the patient was found to have elevated fasting glucose and was started on Lantus.  CURRENT MEDICATIONS:  Prenatal vitamins and Lantus insulin 15 units at bedtime.  No known drug allergies.  The patient presented for scheduled cesarean section on February 07, 2011.  ADMISSION LABORATORY DATA:  CBC which showed WBCs 11.2, hemoglobin 12.4, hematocrit 36.0, and platelets of 152.  MRSA by PCR was negative and RPR negative.  Blood glucose was 123.  SURGERY:  The patient underwent repeat cesarean section on February 07, 2011, for previous cesarean section and macrosomia.  The patient delivered a female infant with a weight of 12 pounds and 3 ounces and Apgars of 9 and 9.  Newborn was transferred to  Regular Nursery.  Please see operative report for further details.  Postoperative course was complicated by dependent edema, otherwise stable.  POSTOPERATIVE LABORATORY DATA:  WBCs 11.7, hemoglobin 10.4, hematocrit 30.2, and platelets 134.  Blood glucose repeated on postop day #2 was 77 and 88.  The patient's vital signs remained stable.  She was afebrile throughout hospital stay.  Physical exam was within normal limits.  Wound was well approximated with staples which were removed prior to discharge and replaced with Steri-Strips.  Newborn is being breastfed and underwent circumcision during hospital stay.  DISCHARGE CARE:  The patient was discharged home on postop day #3 in stable condition.  DIET:  Regular.  ACTIVITY:  Ad lib with postop weight lifting restrictions x2 weeks and instructions per [facility] booklet.  DISCHARGE MEDICATIONS: 1. Prenatal vitamins 1 tablet p.o. daily. 2. Ibuprofen 800 mg p.o. q.8 h. 3. Tylox 1-2 tablets p.o. q.4-6 h. p.r.n. 4. Hydrochlorothiazide 12.5 p.o. q.a.m. p.r.n. for dependent edema.  The patient is to follow up in 6 weeks for postpartum visit.    ______________________________ Arlan Organ, CNM   ______________________________ Lendon Colonel, MD    DP/MEDQ  D:  02/10/2011  T:  02/11/2011  Job:  [id number]  Electronically Signed by Arlan Organ CNM on 02/13/2011 01:33:12  AM Electronically Signed by Noland Fordyce MD on 02/16/2011 11:29:58 PM

## 2011-04-22 ENCOUNTER — Other Ambulatory Visit: Payer: Self-pay | Admitting: Obstetrics

## 2011-04-23 ENCOUNTER — Encounter (HOSPITAL_COMMUNITY)
Admission: RE | Disposition: A | Discharge status: Home / Self Care | Payer: Self-pay | Source: Ambulatory Visit | Attending: Obstetrics

## 2011-04-23 ENCOUNTER — Ambulatory Visit (HOSPITAL_COMMUNITY): Payer: 59

## 2011-04-23 ENCOUNTER — Encounter (HOSPITAL_COMMUNITY): Payer: Self-pay | Admitting: Anesthesiology

## 2011-04-23 ENCOUNTER — Ambulatory Visit (HOSPITAL_COMMUNITY)
Admission: RE | Admit: 2011-04-23 | Discharge: 2011-04-23 | Disposition: A | Discharge status: Home / Self Care | Payer: 59 | Source: Ambulatory Visit | Attending: Obstetrics | Admitting: Obstetrics

## 2011-04-23 ENCOUNTER — Other Ambulatory Visit: Payer: Self-pay | Admitting: Obstetrics

## 2011-04-23 ENCOUNTER — Encounter (HOSPITAL_COMMUNITY): Payer: Self-pay

## 2011-04-23 ENCOUNTER — Ambulatory Visit (HOSPITAL_COMMUNITY): Payer: 59 | Admitting: Anesthesiology

## 2011-04-23 HISTORY — PX: DILATION AND EVACUATION: SHX1459

## 2011-04-23 HISTORY — DX: Cardiac murmur, unspecified: R01.1

## 2011-04-23 LAB — CBC
Hemoglobin: 13.7 g/dL (ref 12.0–15.0)
MCH: 28.5 pg (ref 26.0–34.0)
RBC: 4.8 MIL/uL (ref 3.87–5.11)

## 2011-04-23 LAB — TYPE AND SCREEN
ABO/RH(D): O POS
Antibody Screen: NEGATIVE

## 2011-04-23 SURGERY — DILATION AND EVACUATION, UTERUS
Anesthesia: Monitor Anesthesia Care | Site: Uterus | Wound class: Clean Contaminated

## 2011-04-23 MED ORDER — GENTAMICIN SULFATE 40 MG/ML IJ SOLN
140.0000 mg | Freq: Once | INTRAMUSCULAR | Status: DC
  Filled 2011-04-23: qty 3.5

## 2011-04-23 MED ORDER — PROPOFOL 10 MG/ML IV EMUL
INTRAVENOUS | Status: DC | PRN
  Administered 2011-04-23 (×6): 40 mg via INTRAVENOUS
  Administered 2011-04-23: 20 mg via INTRAVENOUS

## 2011-04-23 MED ORDER — KETOROLAC TROMETHAMINE 30 MG/ML IJ SOLN
INTRAMUSCULAR | Status: AC
  Filled 2011-04-23: qty 1

## 2011-04-23 MED ORDER — ONDANSETRON HCL 4 MG/2ML IJ SOLN
INTRAMUSCULAR | Status: AC
  Filled 2011-04-23: qty 2

## 2011-04-23 MED ORDER — LIDOCAINE HCL 1 % IJ SOLN
INTRAMUSCULAR | Status: DC | PRN
  Administered 2011-04-23: 20 mL

## 2011-04-23 MED ORDER — CLINDAMYCIN PHOSPHATE 900 MG/50ML IV SOLN
900.0000 mg | Freq: Once | INTRAVENOUS | Status: DC
  Filled 2011-04-23: qty 50

## 2011-04-23 MED ORDER — KETOROLAC TROMETHAMINE 30 MG/ML IJ SOLN
INTRAMUSCULAR | Status: DC | PRN
  Administered 2011-04-23: 30 mg via INTRAVENOUS

## 2011-04-23 MED ORDER — PROPOFOL 10 MG/ML IV EMUL
INTRAVENOUS | Status: AC
  Filled 2011-04-23: qty 20

## 2011-04-23 MED ORDER — ONDANSETRON HCL 4 MG/2ML IJ SOLN
INTRAMUSCULAR | Status: DC | PRN
  Administered 2011-04-23: 4 mg via INTRAVENOUS

## 2011-04-23 MED ORDER — PROPOFOL 10 MG/ML IV EMUL
INTRAVENOUS | Status: AC
  Filled 2011-04-23: qty 50

## 2011-04-23 MED ORDER — CLINDAMYCIN PHOSPHATE 600 MG/50ML IV SOLN
INTRAVENOUS | Status: DC | PRN
  Administered 2011-04-23: 900 mg via INTRAVENOUS

## 2011-04-23 MED ORDER — ACETAMINOPHEN 10 MG/ML IV SOLN
1000.0000 mg | Freq: Four times a day (QID) | INTRAVENOUS | Status: DC | PRN
  Administered 2011-04-23 (×2): 1000 mg via INTRAVENOUS
  Filled 2011-04-23: qty 100

## 2011-04-23 MED ORDER — LACTATED RINGERS IV SOLN
INTRAVENOUS | Status: DC
  Administered 2011-04-23: 10:00:00 via INTRAVENOUS
  Administered 2011-04-23: 125 mL/h via INTRAVENOUS

## 2011-04-23 MED ORDER — GENTAMICIN IN SALINE 1.6-0.9 MG/ML-% IV SOLN
INTRAVENOUS | Status: DC | PRN
  Administered 2011-04-23: 140 mg via INTRAVENOUS

## 2011-04-23 MED ORDER — LIDOCAINE HCL (CARDIAC) 20 MG/ML IV SOLN
INTRAVENOUS | Status: AC
  Filled 2011-04-23: qty 5

## 2011-04-23 SURGICAL SUPPLY — 16 items
CATH ROBINSON RED A/P 16FR (CATHETERS) ×2 IMPLANT
CLOTH BEACON ORANGE TIMEOUT ST (SAFETY) ×2 IMPLANT
DECANTER SPIKE VIAL GLASS SM (MISCELLANEOUS) ×2 IMPLANT
GLOVE BIO SURGEON STRL SZ 6.5 (GLOVE) ×2 IMPLANT
GLOVE BIOGEL PI IND STRL 7.0 (GLOVE) ×2 IMPLANT
GLOVE BIOGEL PI INDICATOR 7.0 (GLOVE) ×2
KIT BERKELEY 1ST TRIMESTER 3/8 (MISCELLANEOUS) ×2 IMPLANT
NDL SPNL 22GX3.5 QUINCKE BK (NEEDLE) ×1 IMPLANT
NEEDLE SPNL 22GX3.5 QUINCKE BK (NEEDLE) ×2 IMPLANT
NS IRRIG 1000ML POUR BTL (IV SOLUTION) ×2 IMPLANT
PACK VAGINAL MINOR WOMEN LF (CUSTOM PROCEDURE TRAY) ×2 IMPLANT
PAD PREP 24X48 CUFFED NSTRL (MISCELLANEOUS) ×2 IMPLANT
SET BERKELEY SUCTION TUBING (SUCTIONS) ×2 IMPLANT
SYR CONTROL 10ML LL (SYRINGE) ×2 IMPLANT
TOWEL OR 17X24 6PK STRL BLUE (TOWEL DISPOSABLE) ×4 IMPLANT
VACURETTE 7MM CVD STRL WRAP (CANNULA) ×1 IMPLANT

## 2011-04-23 NOTE — Op Note (Signed)
Indication: Retained POC is History of present illness: Space space 35 year old 2 weeks postpartum with evidence of retained POC's on ultrasound and complaints of bleeding and foul odor to vaginal discharge  Procedure: Space after informed consent the patient was taken to the operating room where IV sedation was initiated without difficulty. She was prepped and draped in the normal sterile fashion with Betadine prep. No catheterization was done as the patient voided immediately preop. A bimanual examination was done to assess the size and the position of the uterus. A sterile speculum was inserted into the vagina, a single-tooth tenaculum was used to grasp the anterior lip of the cervix. 30 cc of 1% lidocaine were infused both at 12:00 into the cervix and at 5 and 7:00 in the cervical paracervical junction. Using the tenaculum to straighten the cervix serial dilation was done with Shawnie Pons dilators while watching the ultrasound screen serial  dilation was done to a [employer].  a 7 French suction curet was inserted just past the internal os and on suction removal of POC's. ultrasound guidance was used throughout the process. several passes were made to remove all the contents in the uterus. A gentle sharp curettage was done to remove a small portion of placental tissue in the right posterior aspect of the uterus. Ultrasound confirmed a 5 mm stripe with no evidence of blood flow at the end of the procedure. Silver nitrate and pressure refused placed on the tenaculum site to control bleeding. The patient tolerated the procedure well sponge lap and needle counts were correct the patient was taken to the recovery room in stable condition.  Zalan Shidler A. 04/23/2011 10:51 AM

## 2011-04-23 NOTE — Anesthesia Postprocedure Evaluation (Signed)
Anesthesia Post Note  Patient: [name]  Procedure(s) Performed:  DILATATION AND EVACUATION (D&E) - Ultrasound Guidance  Anesthesia type: MAC  Patient location: PACU  Post pain: Pain level controlled  Post assessment: Post-op Vital signs reviewed  Last Vitals:  Filed Vitals:   04/23/11 1100  BP: 113/65  Pulse: 73  Temp: 98.2 F (36.8 C)  Resp: 18    Post vital signs: Reviewed  Level of consciousness: sedated  Complications: No apparent anesthesia complications

## 2011-04-23 NOTE — H&P (Signed)
CC: retained POCs  HPI: 35 yo 2 months PP from [facility] for macrosomia in setting of insulin dependant GDM. Pt presented to office w/ c/o foul odor to vaginal d/c and heavy vaginal bleeding. Bleeding has slowed prior to eval and u/s done revealed retained POC's. Pt given Clindamycin oral for possible endometritis and gien 2d methergine to help evacuated POCs. Pt now c/o continued foul smelling d/c (though improved since on abx) and no significant passage of tissue. Repeat u/s this am reveals continued small amount of retained POCs and pt consented to [facility]. Pt notes mild abd pain, no fevers. Pt aware risks of adhesions, perforation, bleeding and infection.  Past Medical History  Diagnosis Date  . Heart murmur     during pregnancy  GDM, PP had elevated FBS but no evidence persistant DM Borderline bps  Meds: Clinda All: none  PE:  Filed Vitals:   04/23/11 0840  BP: 141/85  Pulse: 76  Temp: 99 F (37.2 C)  TempSrc: Oral  Resp: 18  Weight: 90.266 kg (199 lb)  SpO2: 100%   Gen: well appearing Abd: obese,  NT GU: cvx closed, no CMT, uterus not particularly enlarged, difficult to palpate due to obesity LE: NT, no edema  CBC    Component Value Date/Time   WBC 6.9 04/23/2011 0835   RBC 4.80 04/23/2011 0835   HGB 13.7 04/23/2011 0835   HCT 41.1 04/23/2011 0835   PLT 229 04/23/2011 0835   MCV 85.6 04/23/2011 0835   MCH 28.5 04/23/2011 0835   MCHC 33.3 04/23/2011 0835   RDW 13.4 04/23/2011 0835    A/P: 2 months PP, no IC since delivery, w/ thickened EMS with flow c/w retained POCs. Given foul odor, 2 months PP and no resolution w/ methergine, will move to  OR for u/s guided D&C. - gent/ Clinda prophylaxis  Samel Bruna A. 04/23/2011 10:12 AM

## 2011-04-23 NOTE — Anesthesia Preprocedure Evaluation (Addendum)
Anesthesia Evaluation  Name, MR# and DOB Patient awake  General Assessment Comment  Reviewed: Allergy & Precautions, H&P , Patient's Chart, lab work & pertinent test results, reviewed documented beta blocker date and time   History of Anesthesia Complications Negative for: history of anesthetic complications  Airway Mallampati: II TM Distance: >3 FB Neck ROM: full    Dental No notable dental hx.    Pulmonary (+) shortness of breath   clear to auscultation  pulmonary exam normalPulmonary Exam Normal breath sounds clear to auscultation none    Cardiovascular Exercise Tolerance: Good - Valvular Problems/Murmursregular Normal    Neuro/Psych Negative Neurological ROS  Negative Psych ROS  GI/Hepatic/Renal negative GI ROS  negative Liver ROS  negative Renal ROS        Endo/Other  Negative Endocrine ROS (+)      Abdominal   Musculoskeletal   Hematology negative hematology ROS (+)   Peds  Reproductive/Obstetrics negative OB ROS    Anesthesia Other Findings            Anesthesia Physical Anesthesia Plan  ASA: I  Anesthesia Plan: MAC   Post-op Pain Management:    Induction:   Airway Management Planned:   Additional Equipment:   Intra-op Plan:   Post-operative Plan:   Informed Consent: I have reviewed the patients History and Physical, chart, labs and discussed the procedure including the risks, benefits and alternatives for the proposed anesthesia with the patient or authorized representative who has indicated his/her understanding and acceptance.   Dental Advisory Given  Plan Discussed with: CRNA and Surgeon  Anesthesia Plan Comments: (Pt prefers to avoid narcotics.  IV tylenol ordered)        Anesthesia Quick Evaluation

## 2011-04-23 NOTE — Transfer of Care (Signed)
Immediate Anesthesia Transfer of Care Note  Patient: [name]  Procedure(s) Performed:  DILATATION AND EVACUATION (D&E) - Ultrasound Guidance  Patient Location: PACU  Anesthesia Type: MAC  Level of Consciousness: awake, alert  and oriented  Airway & Oxygen Therapy: Patient Spontanous Breathing  Post-op Assessment: Report given to PACU RN, Post -op Vital signs reviewed and stable and Patient moving all extremities X 4  Post vital signs: Reviewed and stable  Complications: No apparent anesthesia complications

## 2011-04-23 NOTE — Brief Op Note (Signed)
04/23/2011  10:44 AM  PATIENT:  [name]  35 y.o. female  PRE-OPERATIVE DIAGNOSIS:  Retained Placenta  POST-OPERATIVE DIAGNOSIS:  Retained Placenta  PROCEDURE:  Procedure(s): DILATATION AND EVACUATION (D&E)  SURGEON:  Surgeon(s): Tresa Endo A. Tenleigh Byer  PHYSICIAN ASSISTANT: none  ASSISTANTS: none   ANESTHESIA:   local and IV sedation  OR FLUID I/O:  Total I/O In: 1000 [I.V.:1000] Out: 25 [Blood:25]  BLOOD ADMINISTERED:none  DRAINS: none   LOCAL MEDICATIONS USED:  LIDOCAINE 30 CC  SPECIMEN:  Source of Specimen:  retained POCs  DISPOSITION OF SPECIMEN:  PATHOLOGY  COUNTS:  YES  TOURNIQUET:  * No tourniquets in log *  DICTATION: .Note written in EPIC  PLAN OF CARE: Discharge to home after PACU  PATIENT DISPOSITION:  PACU - hemodynamically stable.   Delay start of Pharmacological VTE agent (>24hrs) due to surgical blood loss or risk of bleeding:  yes

## 2011-04-25 ENCOUNTER — Encounter (HOSPITAL_COMMUNITY): Payer: Self-pay | Admitting: Obstetrics

## 2011-11-04 ENCOUNTER — Emergency Department
Admission: EM | Admit: 2011-11-04 | Discharge: 2011-11-04 | Disposition: A | Discharge status: Home / Self Care | Payer: 59 | Source: Home / Self Care | Attending: Emergency Medicine | Admitting: Emergency Medicine

## 2011-11-04 DIAGNOSIS — H109 Unspecified conjunctivitis: Secondary | ICD-10-CM

## 2011-11-04 MED ORDER — POLYMYXIN B-TRIMETHOPRIM 10000-0.1 UNIT/ML-% OP SOLN: 1.0000 [drp] | Freq: Four times a day (QID) | OPHTHALMIC | Status: AC

## 2011-11-04 NOTE — ED Provider Notes (Signed)
History     CSN: [id number]  Arrival date & time 11/04/11  1220   First MD Initiated Contact with Patient 11/04/11 1227      Chief Complaint  Patient presents with  . Conjunctivitis    (Consider location/radiation/quality/duration/timing/severity/associated sxs/prior treatment) HPI [name] presents today with an EYE COMPLAINT  Location: right  Onset: 1  Days   Symptoms: Redness: yes Discharge: yes Pain: no Photophobia: no Decreased Vision: no URI symptoms: no Itching/Allergy sxs: no Glaucoma: no Recent eye surgery: no Contact lens use: no  Red Flags Trauma: no Foreign Body: no Vomiting/HA: no Halos around lights: no Chickenpox or zoster: no     Past Medical History  Diagnosis Date  . Heart murmur     during pregnancy    Past Surgical History  Procedure Date  . Cesarean section   . Dilation and evacuation 04/23/2011    Procedure: DILATATION AND EVACUATION (D&E);  Surgeon: Alphonsus Sias. Fogleman;  Location: [facility];  Service: Gynecology;  Laterality: N/A;  Ultrasound Guidance    Family History  Problem Relation Age of Onset  . Sudden death Mother     Age 79; ? MI  . Coronary artery disease Father     Age 56    History  Substance Use Topics  . Smoking status: Never Smoker   . Smokeless tobacco: Not on file  . Alcohol Use: Yes     Occasional; none while pregnant    OB History    Grav Para Term Preterm Abortions TAB SAB Ect Mult Living                  Review of Systems  All other systems reviewed and are negative.    Allergies  Review of patient's allergies indicates no known allergies.  Home Medications   Current Outpatient Rx  Name Route Sig Dispense Refill  . ASPIRIN 81 MG PO TABS Oral Take 81 mg by mouth daily.      [name] VITAMIN D 1000 UNITS PO CAPS Oral Take 1,000 Units by mouth daily.     . CHOLECALCIFEROL 400 UNITS PO TABS Oral Take 400 Units by mouth daily.      [name] CLINDAMYCIN HCL 300 MG PO CAPS Oral Take 300 mg by mouth 3 (three)  times daily.      . IBUPROFEN 200 MG PO TABS Oral Take 400 mg by mouth daily as needed. pain     . PRENATAL 1 PO Oral Take by mouth. 1 tab po qd     . POLYMYXIN B-TRIMETHOPRIM 10000-0.1 UNIT/ML-% OP SOLN Both Eyes Place 1 drop into both eyes every 6 (six) hours. 10 mL 0    BP 110/73  Pulse 71  Temp(Src) 98.8 F (37.1 C) (Oral)  Resp 18  Ht 5\' 8"  (1.727 m)  Wt 210 lb (95.255 kg)  BMI 31.93 kg/m2  SpO2 97%  Physical Exam  Nursing note and vitals reviewed. Constitutional: She is oriented to person, place, and time. She appears well-developed and well-nourished.  HENT:  Head: Normocephalic and atraumatic.  Right Ear: Tympanic membrane, external ear and ear canal normal.  Left Ear: Tympanic membrane, external ear and ear canal normal.  Nose: Nose normal.  Mouth/Throat: Oropharynx is clear and moist.  Eyes: Right eye exhibits no chemosis, no discharge and no exudate. Left eye exhibits no chemosis, no discharge and no exudate. Right conjunctiva is injected (mild). Left conjunctiva is not injected. No scleral icterus.  Neck: Neck supple.  Cardiovascular: Regular rhythm and  normal heart sounds.   Pulmonary/Chest: Effort normal and breath sounds normal. No respiratory distress.  Neurological: She is alert and oriented to person, place, and time.  Skin: Skin is warm and dry.  Psychiatric: She has a normal mood and affect. Her speech is normal.    ED Course  Procedures (including critical care time)  Labs Reviewed - No data to display No results found.   1. Conjunctivitis       MDM   Patient symptoms consistent with conjunctivitis right eye.  Given her a prescription for Polytrim eyedrops.  She is contagious and I've advised hand hygiene especially when around her children.  Throw out her recent eye makeup.  Followup PCP or ophthalmology if not improving.        Marlaine Hind, MD 11/04/11 1240

## 2011-11-04 NOTE — ED Notes (Signed)
Rt eye, itchy, red, burns x 1 day

## 2012-10-17 ENCOUNTER — Ambulatory Visit: Payer: 59 | Admitting: Physician Assistant

## 2012-10-19 ENCOUNTER — Ambulatory Visit (INDEPENDENT_AMBULATORY_CARE_PROVIDER_SITE_OTHER): Payer: 59 | Admitting: Physician Assistant

## 2012-10-19 ENCOUNTER — Encounter: Payer: Self-pay | Admitting: Physician Assistant

## 2012-10-19 ENCOUNTER — Other Ambulatory Visit: Payer: Self-pay | Admitting: Physician Assistant

## 2012-10-19 VITALS — BP 124/76 | HR 77 | Ht 68.0 in | Wt 214.0 lb

## 2012-10-19 DIAGNOSIS — N6459 Other signs and symptoms in breast: Secondary | ICD-10-CM

## 2012-10-19 DIAGNOSIS — Z131 Encounter for screening for diabetes mellitus: Secondary | ICD-10-CM

## 2012-10-19 DIAGNOSIS — L570 Actinic keratosis: Secondary | ICD-10-CM

## 2012-10-19 DIAGNOSIS — R5381 Other malaise: Secondary | ICD-10-CM

## 2012-10-19 DIAGNOSIS — L821 Other seborrheic keratosis: Secondary | ICD-10-CM

## 2012-10-19 DIAGNOSIS — Z83438 Family history of other disorder of lipoprotein metabolism and other lipidemia: Secondary | ICD-10-CM

## 2012-10-19 DIAGNOSIS — N6452 Nipple discharge: Secondary | ICD-10-CM

## 2012-10-19 DIAGNOSIS — Z13220 Encounter for screening for lipoid disorders: Secondary | ICD-10-CM

## 2012-10-19 DIAGNOSIS — R5383 Other fatigue: Secondary | ICD-10-CM

## 2012-10-19 DIAGNOSIS — Z833 Family history of diabetes mellitus: Secondary | ICD-10-CM

## 2012-10-19 NOTE — Patient Instructions (Addendum)
Will refer to dermatology.  Will call with labs.

## 2012-10-21 NOTE — Progress Notes (Addendum)
  Subjective:    Patient ID: [name], female    DOB: [birth year], 37 y.o.   MRN: [id number]  HPI Patient is a 37 yo female who presents to the clinic to establish care. PMH positive for Gestational diabetes, Gestastional heart murmur. She is not on any current medications. Last Pap 2012. Last fasting labs 2012. Family hx of stroke, heart attack, hyperlipidemia. Social, surgical hx reviewed.   Pt has had bilateral white discharge coming from both breast. She denies any pain or palpable lumps. She finished breast feeding in July 2013. Discharge is not daily but off and on. Her breast feel full like they are full of milk. Periods are regular and not painful. Does not use OCP. She is sexually active.she does have appt with OBGYN next week and would like work up there.    Review of Systems  All other systems reviewed and are negative.       Objective:   Physical Exam  Constitutional: She is oriented to person, place, and time. She appears well-developed and well-nourished.  HENT:  Head: Normocephalic and atraumatic.  Eyes: Conjunctivae are normal.  Neck: Normal range of motion. Neck supple. No thyromegaly present.  Cardiovascular: Normal rate, regular rhythm and normal heart sounds.   Did not hear murmur today.  Pulmonary/Chest: Effort normal and breath sounds normal.  Lymphadenopathy:    She has no cervical adenopathy.  Neurological: She is alert and oriented to person, place, and time.  Skin: Skin is warm and dry.  1mm scaly patch with erythematous base on right shoulder anteriorly.   Multiple brown waxy scratch off appearance papules on lower left calf.   Psychiatric: She has a normal mood and affect. Her behavior is normal.          Assessment & Plan:  Bilateral nipple discharge- discussed with pt needed pregnancy test. Pt left without giving sample. Likely will do at [facility] office. Will check TSH. Will leave prolactin level for OB to check. Close to 40 would like to  consider mamogram. Advised her to bring up with obgyn. This could be a normal variant it is not uncommon to have some clostrum from breast after having children.   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   Need screening labs especially with family hx- Ordered will call with results. Would like vitamin d tested since takes daily.

## 2012-10-22 NOTE — Addendum Note (Signed)
Addended by: Jomarie Longs on: 10/22/2012 12:20 AM   Modules accepted: Orders, Level of Service

## 2012-10-24 LAB — CBC
HCT: 39.9 % (ref 36.0–46.0)
MCHC: 35.1 g/dL (ref 30.0–36.0)
MCV: 84.4 fL (ref 78.0–100.0)
Platelets: 240 10*3/uL (ref 150–400)
RDW: 13.6 % (ref 11.5–15.5)
WBC: 7.8 10*3/uL (ref 4.0–10.5)

## 2012-10-24 LAB — LIPID PANEL
HDL: 42 mg/dL (ref >39)
LDL Cholesterol: 114 mg/dL — ABNORMAL HIGH (ref 0–99)
Triglycerides: 58 mg/dL (ref <150)
VLDL: 12 mg/dL (ref 0–40)

## 2012-10-24 LAB — COMPREHENSIVE METABOLIC PANEL
AST: 10 U/L (ref 0–37)
Albumin: 4.1 g/dL (ref 3.5–5.2)
Alkaline Phosphatase: 55 U/L (ref 39–117)
Glucose, Bld: 110 mg/dL — ABNORMAL HIGH (ref 70–99)
Potassium: 4.2 mEq/L (ref 3.5–5.3)
Sodium: 140 mEq/L (ref 135–145)
Total Bilirubin: 0.5 mg/dL (ref 0.3–1.2)
Total Protein: 6.4 g/dL (ref 6.0–8.3)

## 2012-10-24 LAB — TSH: TSH: 2.586 u[IU]/mL (ref 0.350–4.500)

## 2012-10-26 LAB — HM PAP SMEAR: HM PAP: NEGATIVE

## 2012-11-19 ENCOUNTER — Emergency Department (INDEPENDENT_AMBULATORY_CARE_PROVIDER_SITE_OTHER): Payer: 59

## 2012-11-19 ENCOUNTER — Emergency Department
Admission: EM | Admit: 2012-11-19 | Discharge: 2012-11-19 | Disposition: A | Discharge status: Home / Self Care | Payer: 59 | Source: Home / Self Care | Attending: Family Medicine | Admitting: Family Medicine

## 2012-11-19 ENCOUNTER — Encounter: Payer: Self-pay | Admitting: *Deleted

## 2012-11-19 ENCOUNTER — Telehealth: Payer: Self-pay | Admitting: *Deleted

## 2012-11-19 DIAGNOSIS — M940 Chondrocostal junction syndrome [Tietze]: Secondary | ICD-10-CM

## 2012-11-19 DIAGNOSIS — R0789 Other chest pain: Secondary | ICD-10-CM

## 2012-11-19 DIAGNOSIS — R079 Chest pain, unspecified: Secondary | ICD-10-CM

## 2012-11-19 HISTORY — DX: Unspecified pre-eclampsia, unspecified trimester: O14.90

## 2012-11-19 LAB — CBC WITH DIFFERENTIAL/PLATELET
Basophils Absolute: 0 10*3/uL (ref 0.0–0.1)
Basophils Relative: 0 % (ref 0–1)
Eosinophils Absolute: 0.2 10*3/uL (ref 0.0–0.7)
Eosinophils Relative: 2 % (ref 0–5)
HCT: 40.2 % (ref 36.0–46.0)
MCH: 29.6 pg (ref 26.0–34.0)
MCHC: 35.1 g/dL (ref 30.0–36.0)
MCV: 84.3 fL (ref 78.0–100.0)
Monocytes Absolute: 0.8 10*3/uL (ref 0.1–1.0)
Platelets: 246 10*3/uL (ref 150–400)
RDW: 13.8 % (ref 11.5–15.5)

## 2012-11-19 MED ORDER — KETOROLAC TROMETHAMINE 30 MG/ML IJ SOLN
30.0000 mg | Freq: Once | INTRAMUSCULAR | Status: AC
  Administered 2012-11-19: 30 mg via INTRAMUSCULAR

## 2012-11-19 NOTE — ED Provider Notes (Addendum)
History     CSN: [id number]  Arrival date & time 11/19/12  6063   First MD Initiated Contact with Patient 11/19/12 [phone]      Chief Complaint  Patient presents with  . Chest Pain   HPI Patient is SAB chief complaint of chest pain x3 days. Patient states that she initially noticed pain while she was at work. Pain is predominantly in the center of her chest without radiation. No nausea vomiting or diaphoresis associated with symptoms. Pain seems to be worse in the patient extends her shoulders. Patient is unsure she's had any strenuous activity. Patient does state she has a very heavy 4-month-old malaise over 30 pounds. Patient also works as a Engineer, civil (consulting). No alleviating factors. Pain is more of an ache that is constant. No prior history of hypertension, diabetes. Patient is a nonsmoker. Patient does report a significant family history of heart attack in her father who had an MI when he was 85 at the time.   Past Medical History  Diagnosis Date  . Heart murmur     during pregnancy  . Gestational diabetes   . Preeclampsia     Past Surgical History  Procedure Laterality Date  . Cesarean section    . Dilation and evacuation  04/23/2011    Procedure: DILATATION AND EVACUATION (D&E);  Surgeon: Alphonsus Sias. Fogleman;  Location: [facility];  Service: Gynecology;  Laterality: N/A;  Ultrasound Guidance    Family History  Problem Relation Age of Onset  . Sudden death Mother     Age 65; ? MI  . Coronary artery disease Father     Age 59  . Diabetes Father   . Hyperlipidemia Maternal Aunt     History  Substance Use Topics  . Smoking status: Never Smoker   . Smokeless tobacco: Never Used  . Alcohol Use: Yes     Comment: Occasional; none while pregnant    OB History   Grav Para Term Preterm Abortions TAB SAB Ect Mult Living                  Review of Systems  All other systems reviewed and are negative.    Allergies  Review of patient's allergies indicates no known  allergies.  Home Medications   Current Outpatient Rx  Name  Route  Sig  Dispense  Refill  . cholecalciferol (VITAMIN D) 400 UNITS TABS   Oral   Take 400 Units by mouth daily.             BP 135/79  Pulse 80  Temp(Src) 97.8 F (36.6 C) (Oral)  Resp 16  Ht 5\' 7"  (1.702 m)  Wt 213 lb (96.616 kg)  BMI 33.35 kg/m2  SpO2 98%  Physical Exam  Constitutional: She appears well-developed and well-nourished.  HENT:  Head: Normocephalic and atraumatic.  Eyes: Conjunctivae are normal. Pupils are equal, round, and reactive to light.  Neck: Normal range of motion.  Cardiovascular: Normal rate and regular rhythm.   Pulmonary/Chest: Effort normal.  Positive anterior chest wall tenderness palpation.  Abdominal: Soft.  Musculoskeletal: Normal range of motion.  Neurological: She is alert.  Skin: Skin is warm.    ED Course  Procedures (including critical care time)  Labs Reviewed  CBC WITH DIFFERENTIAL  TROPONIN I   Dg Chest 2 View  11/19/2012  *RADIOLOGY REPORT*  Clinical Data: Chest pain.  CHEST - 2 VIEW  Comparison: None.  Findings: Trachea is midline.  Heart size normal.  Lungs are clear.  No pleural fluid.  IMPRESSION: Negative.   Original Report Authenticated By: Leanna Battles, M.D.    EKG: NSR, no acute ST or T wave abnormalities.   1. Other chest pain   2. Costochondritis       MDM  Symptoms highly atypical for cardiac source. Most consistent with costochondritis. Had a relatively lengthy discussion with patient in terms of treatment options as well as followup. We'll check a CBC and troponins today at patient's request. Patient with one cardiovascular risk factor which is family history. Otherwise relatively healthy. EKG with normal sinus rhythm. Chest x-ray within normal limits. Patient given a shot of Toradol 30 mg IM x1. Will place patient on course of NSAIDs for acute treatment. Patient can discuss followup with a cardiologist with her regular doctor if  symptoms persist. Patient declined cardiology followup today. Discussed general, musculoskeletal, cardiac red flags. Follow up as needed.     The patient and/or caregiver has been counseled thoroughly with regard to treatment plan and/or medications prescribed including dosage, schedule, interactions, rationale for use, and possible side effects and they verbalize understanding. Diagnoses and expected course of recovery discussed and will return if not improved as expected or if the condition worsens. Patient and/or caregiver verbalized understanding.             Doree Albee, MD 11/19/12 1647  Doree Albee, MD 11/19/12 [phone]

## 2012-11-19 NOTE — ED Notes (Signed)
[name] c/o CP in center of her chest that started suddenly 3 days ago. Pain has since been intermittent and has lessened in severity. Denies nausea, diaphoresis, arm pain or numbness. Patient has strong family hx of MI and cardiac disease.

## 2012-11-20 ENCOUNTER — Telehealth: Payer: Self-pay | Admitting: Family Medicine

## 2012-11-20 NOTE — ED Notes (Signed)
Called and discussed results of lab work.  Symptomatically improving from costo.  Follow up with PCP  Doree Albee, MD 11/20/12 [phone]

## 2012-11-22 ENCOUNTER — Telehealth: Payer: Self-pay | Admitting: Emergency Medicine

## 2013-05-28 ENCOUNTER — Encounter: Payer: Self-pay | Admitting: Emergency Medicine

## 2013-05-28 ENCOUNTER — Emergency Department
Admission: EM | Admit: 2013-05-28 | Discharge: 2013-05-28 | Disposition: A | Discharge status: Home / Self Care | Payer: 59 | Source: Home / Self Care | Attending: Family Medicine | Admitting: Family Medicine

## 2013-05-28 DIAGNOSIS — R300 Dysuria: Secondary | ICD-10-CM

## 2013-05-28 DIAGNOSIS — R319 Hematuria, unspecified: Secondary | ICD-10-CM

## 2013-05-28 DIAGNOSIS — R0600 Dyspnea, unspecified: Secondary | ICD-10-CM

## 2013-05-28 DIAGNOSIS — R0609 Other forms of dyspnea: Secondary | ICD-10-CM

## 2013-05-28 LAB — POCT URINALYSIS DIP (MANUAL ENTRY)
Bilirubin, UA: NEGATIVE
Glucose, UA: NEGATIVE
Ketones, POC UA: NEGATIVE
Nitrite, UA: NEGATIVE
Spec Grav, UA: 1.02 (ref 1.005–1.03)
Urobilinogen, UA: 1 (ref 0–1)
pH, UA: 7 (ref 5–8)

## 2013-05-28 MED ORDER — NITROFURANTOIN MONOHYD MACRO 100 MG PO CAPS: 100.0000 mg | ORAL_CAPSULE | Freq: Two times a day (BID) | ORAL | Status: DC

## 2013-05-28 MED ORDER — PHENAZOPYRIDINE HCL 200 MG PO TABS: 200.0000 mg | ORAL_TABLET | Freq: Three times a day (TID) | ORAL | Status: DC

## 2013-05-28 NOTE — ED Notes (Signed)
[name] c/o left flank pain yesterday followed by dysuria and blood clots in her urine suddenly last night. Denies fever. No OTC meds taken.

## 2013-05-28 NOTE — ED Provider Notes (Signed)
CSN: [id number]     Arrival date & time 05/28/13  0818 History   First MD Initiated Contact with Patient 05/28/13 [phone]     Chief Complaint  Patient presents with  . Dysuria      HPI Comments: Patient awoke with left flank pain yesterday that lasted about an hour before resolving.  Yesterday about 10pm she developed a sensation of urinary urgency and pressure, and noted hematuria with some small blood clots present in urine.  She had several episodes of nocturia last night.  This morning she still has mild urgency but no hematuria.  No nausea/vomiting.  No fevers, chills, and sweats.  Patient's last menstrual period was 05/10/2013.   No history of kidney stones. Family History:  Father had kidney stones.  Patient is a 37 y.o. female presenting with dysuria. The history is provided by the patient.  Dysuria Pain quality:  Burning Pain severity:  Mild Onset quality:  Sudden Duration:  1 day Timing:  Intermittent Progression:  Improving Chronicity:  New Recent urinary tract infections: no   Relieved by:  Nothing Worsened by:  Nothing tried Ineffective treatments:  None tried Urinary symptoms: frequent urination and hematuria   Urinary symptoms: no discolored urine, no foul-smelling urine, no hesitancy and no bladder incontinence   Associated symptoms: flank pain   Associated symptoms: no abdominal pain, no fever, no genital lesions, no nausea, no vaginal discharge and no vomiting   Risk factors: no hx of pyelonephritis, no hx of urolithiasis, not pregnant, no recurrent urinary tract infections and no renal disease     Past Medical History  Diagnosis Date  . Heart murmur     during pregnancy  . Gestational diabetes   . Preeclampsia    Past Surgical History  Procedure Laterality Date  . Cesarean section    . Dilation and evacuation  04/23/2011    Procedure: DILATATION AND EVACUATION (D&E);  Surgeon: Alphonsus Sias. Fogleman;  Location: [facility];  Service: Gynecology;  Laterality: N/A;   Ultrasound Guidance   Family History  Problem Relation Age of Onset  . Sudden death Mother     Age 41; ? MI  . Coronary artery disease Father     Age 75  . Diabetes Father   . Hyperlipidemia Maternal Aunt    History  Substance Use Topics  . Smoking status: Never Smoker   . Smokeless tobacco: Never Used  . Alcohol Use: Yes     Comment: Occasional; none while pregnant   OB History   Grav Para Term Preterm Abortions TAB SAB Ect Mult Living                 Review of Systems  Constitutional: Negative for fever.  Gastrointestinal: Negative for nausea, vomiting and abdominal pain.  Genitourinary: Positive for dysuria and flank pain. Negative for vaginal discharge.  All other systems reviewed and are negative.    Allergies  Review of patient's allergies indicates no known allergies.  Home Medications   Current Outpatient Rx  Name  Route  Sig  Dispense  Refill  . cholecalciferol (VITAMIN D) 400 UNITS TABS   Oral   Take 400 Units by mouth daily.           . nitrofurantoin, macrocrystal-monohydrate, (MACROBID) 100 MG capsule   Oral   Take 1 capsule (100 mg total) by mouth 2 (two) times daily.   14 capsule   0   . phenazopyridine (PYRIDIUM) 200 MG tablet   Oral   Take  1 tablet (200 mg total) by mouth 3 (three) times daily. Take with food.   6 tablet   0    BP 125/83  Pulse 86  Temp(Src) 98.3 F (36.8 C) (Oral)  Resp 16  Wt 212 lb (96.163 kg)  BMI 33.2 kg/m2  SpO2 95%  LMP 05/10/2013 Physical Exam Nursing notes and Vital Signs reviewed. Appearance:  Patient appears stated age, and in no acute distress.  Patient is obese (BMI 33.2) Eyes:  Pupils are equal, round, and reactive to light and accomodation.  Extraocular movement is intact.   Pharynx:  Normal Neck:  Supple.   Lungs:  Clear to auscultation.  Breath sounds are equal.  Heart:  Regular rate and rhythm without murmurs, rubs, or gallops.  Abdomen:   Mild suprapubic tenderness without masses or  hepatosplenomegaly.  Bowel sounds are present.  No CVA or flank tenderness.  Extremities:  No edema.   Skin:  No rash present.   ED Course  Procedures  none    Labs Reviewed  URINE CULTURE pending  POCT URINALYSIS DIP (MANUAL ENTRY) BLO Large; PRO 100mg /dL; LEU small         MDM       1. Dysuria  2. Hematuria;  ?nephrolithiasis    Urine culture pending.  Begin Macrobid.  Rx given for Pyridium Continue increased fluid intake.  May take Ibuprofen 200mg , 4 tabs every 8 hours with food.  Followup with Family Doctor if not improved in about 5 days or if symptoms worsen. If symptoms become significantly worse during the night or over the weekend, proceed to the local emergency room.     Lattie Haw, MD 05/28/13 [phone]

## 2013-05-30 LAB — URINE CULTURE: Colony Count: >=100000

## 2013-05-31 ENCOUNTER — Telehealth: Payer: Self-pay | Admitting: *Deleted

## 2013-05-31 NOTE — Telephone Encounter (Signed)
Message copied by Edilia Bo on Fri May 31, 2013 12:08 PM ------      Message from: Donna Christen A      Created: Fri May 31, 2013 12:03 PM       Discontinue Macrobid. Switch to Cipro 500mg , one tab Q12HR for 7 days, Rx #14, no ref ------

## 2013-06-13 ENCOUNTER — Other Ambulatory Visit: Payer: Self-pay

## 2013-09-10 ENCOUNTER — Other Ambulatory Visit: Payer: Self-pay | Admitting: Dermatology

## 2013-10-01 ENCOUNTER — Encounter: Payer: Self-pay | Admitting: Emergency Medicine

## 2013-10-01 ENCOUNTER — Emergency Department
Admission: EM | Admit: 2013-10-01 | Discharge: 2013-10-01 | Disposition: A | Discharge status: Home / Self Care | Payer: 59 | Source: Home / Self Care | Attending: Emergency Medicine | Admitting: Emergency Medicine

## 2013-10-01 DIAGNOSIS — J0100 Acute maxillary sinusitis, unspecified: Secondary | ICD-10-CM

## 2013-10-01 MED ORDER — FLUTICASONE PROPIONATE 50 MCG/ACT NA SUSP: NASAL | Status: DC

## 2013-10-01 MED ORDER — AMOXICILLIN-POT CLAVULANATE 875-125 MG PO TABS: 1.0000 | ORAL_TABLET | Freq: Two times a day (BID) | ORAL | Status: DC

## 2013-10-01 NOTE — ED Provider Notes (Signed)
CSN: [id number]     Arrival date & time 10/01/13  1602 History   First MD Initiated Contact with Patient 10/01/13 1604     Chief Complaint  Patient presents with  . Cough  . Nasal Congestion  . Ear Fullness    HPI URI HISTORY  [name] is a 38 y.o. female who complains of onset of cold symptoms for 21 days.  Have been using over-the-counter treatment which helps a little bit.  She works as an Therapist, sports. She is 2 young children with URI symptoms  No chills/sweats +  Low-grade Fever  +  Nasal congestion +  Discolored yellow-green nasal discharge and Post-nasal drainage Positive sinus pain/pressure Mild sore throat  +  Minimal cough No wheezing No chest congestion No hemoptysis No shortness of breath No pleuritic pain  No itchy/red eyes No earache, but both ears feel full with mild pressure sensation  No nausea No vomiting No abdominal pain No diarrhea  No skin rashes +  Fatigue No myalgias No headache, except for sinus type headache without any focal neurologic symptoms No vision change   Past Medical History  Diagnosis Date  . Heart murmur     during pregnancy  . Gestational diabetes   . Preeclampsia    Past Surgical History  Procedure Laterality Date  . Cesarean section    . Dilation and evacuation  04/23/2011    Procedure: DILATATION AND EVACUATION (D&E);  Surgeon: Floyce Stakes. Fogleman;  Location: [facility];  Service: Gynecology;  Laterality: N/A;  Ultrasound Guidance   Family History  Problem Relation Age of Onset  . Sudden death Mother     Age 60; ? MI  . Coronary artery disease Father     Age 10  . Diabetes Father   . Hyperlipidemia Maternal Aunt    History  Substance Use Topics  . Smoking status: Never Smoker   . Smokeless tobacco: Never Used  . Alcohol Use: No   OB History   Grav Para Term Preterm Abortions TAB SAB Ect Mult Living                 Review of Systems  All other systems reviewed and are negative.      Allergies  Review of  patient's allergies indicates no known allergies.  Home Medications   Current Outpatient Rx  Name  Route  Sig  Dispense  Refill  . GARCINIA CAMBOGIA-CHROMIUM PO   Oral   Take by mouth.         Nyoka Cowden Tea, Camillia sinensis, (GREEN TEA PO)   Oral   Take by mouth.         . Linoleic Acid Conjugated (CLA PO)   Oral   Take by mouth.         [name] amoxicillin-clavulanate (AUGMENTIN) 875-125 MG per tablet   Oral   Take 1 tablet by mouth 2 (two) times daily. For 10 days. Take with food.   20 tablet   0   . cholecalciferol (VITAMIN D) 400 UNITS TABS   Oral   Take 400 Units by mouth daily.           . fluticasone (FLONASE) 50 MCG/ACT nasal spray      1 or 2 sprays each nostril twice a day   16 g   0    BP 123/76  Pulse 78  Temp(Src) 98 F (36.7 C) (Oral)  Resp 18  Wt 207 lb 12 oz (94.235 kg)  SpO2 96% Physical  Exam  Nursing note and vitals reviewed. Constitutional: She is oriented to person, place, and time. She appears well-developed and well-nourished.  Non-toxic appearance. She appears ill. No distress.  HENT:  Head: Normocephalic and atraumatic.  Right Ear: External ear and ear canal normal. No drainage. Tympanic membrane is injected. Tympanic membrane is not perforated and not erythematous. A middle ear effusion is present. No decreased hearing is noted.  Left Ear: External ear and ear canal normal. No drainage. Tympanic membrane is injected. Tympanic membrane is not perforated and not erythematous. A middle ear effusion is present. No decreased hearing is noted.  Nose: Mucosal edema and rhinorrhea present. Right sinus exhibits maxillary sinus tenderness. Left sinus exhibits maxillary sinus tenderness.  Mouth/Throat: Oropharynx is clear and moist. No oral lesions. No oropharyngeal exudate.  Eyes: Right eye exhibits no discharge. Left eye exhibits no discharge. No scleral icterus.  Neck: Neck supple.  Cardiovascular: Normal rate, regular rhythm and normal heart  sounds.   Pulmonary/Chest: Effort normal and breath sounds normal. No respiratory distress. She has no wheezes. She has no rales.  Lymphadenopathy:    She has no cervical adenopathy.  Neurological: She is alert and oriented to person, place, and time.  Skin: Skin is warm and dry.   no rash  ED Course  Procedures (including critical care time) Labs Review Labs Reviewed - No data to display Imaging Review No results found.    MDM   Final diagnoses:  Acute maxillary sinusitis    Treatment options discussed, as well as risks, benefits, alternatives. Patient voiced understanding and agreement with the following plans: Augmentin 875 twice a day x10 days Flonase twice a day Mucinex D and other symptomatic care discussed Follow-up with your primary care doctor in 5-7 days if not improving, or sooner if symptoms become worse. Precautions discussed. Red flags discussed. Questions invited and answered. Patient voiced understanding and agreement.     Jacqulyn Cane, MD 10/01/13 [phone]

## 2013-10-01 NOTE — ED Notes (Signed)
Pt c/o chest congestion, nasal congestion, and bilateral ear fullness x 3 wks, worse x 1 day.

## 2013-12-06 HISTORY — PX: OTHER SURGICAL HISTORY: SHX169

## 2014-06-18 ENCOUNTER — Encounter: Payer: Self-pay | Admitting: Physician Assistant

## 2014-06-18 ENCOUNTER — Ambulatory Visit (INDEPENDENT_AMBULATORY_CARE_PROVIDER_SITE_OTHER): Payer: 59 | Admitting: Physician Assistant

## 2014-06-18 VITALS — BP 105/62 | HR 74 | Ht 67.0 in | Wt 201.0 lb

## 2014-06-18 DIAGNOSIS — Z0000 Encounter for general adult medical examination without abnormal findings: Secondary | ICD-10-CM

## 2014-06-18 DIAGNOSIS — Z13220 Encounter for screening for lipoid disorders: Secondary | ICD-10-CM

## 2014-06-18 DIAGNOSIS — Z9889 Other specified postprocedural states: Secondary | ICD-10-CM | POA: Insufficient documentation

## 2014-06-18 DIAGNOSIS — Z131 Encounter for screening for diabetes mellitus: Secondary | ICD-10-CM

## 2014-06-18 DIAGNOSIS — R635 Abnormal weight gain: Secondary | ICD-10-CM

## 2014-06-18 DIAGNOSIS — Z1239 Encounter for other screening for malignant neoplasm of breast: Secondary | ICD-10-CM

## 2014-06-18 LAB — COMPLETE METABOLIC PANEL WITH GFR
ALBUMIN: 3.9 g/dL (ref 3.5–5.2)
ALK PHOS: 49 U/L (ref 39–117)
ALT: 12 U/L (ref 0–35)
AST: 13 U/L (ref 0–37)
BUN: 14 mg/dL (ref 6–23)
CO2: 27 mEq/L (ref 19–32)
CREATININE: 0.73 mg/dL (ref 0.50–1.10)
Calcium: 9.2 mg/dL (ref 8.4–10.5)
Chloride: 104 mEq/L (ref 96–112)
GFR, Est African American: >89 mL/min
GLUCOSE: 106 mg/dL — AB (ref 70–99)
POTASSIUM: 4.2 meq/L (ref 3.5–5.3)
Sodium: 139 mEq/L (ref 135–145)
Total Bilirubin: 0.9 mg/dL (ref 0.2–1.2)
Total Protein: 6.3 g/dL (ref 6.0–8.3)

## 2014-06-18 LAB — LIPID PANEL
Cholesterol: 156 mg/dL (ref 0–200)
HDL: 48 mg/dL (ref >39)
LDL Cholesterol: 96 mg/dL (ref 0–99)
Total CHOL/HDL Ratio: 3.3 Ratio
Triglycerides: 58 mg/dL (ref <150)
VLDL: 12 mg/dL (ref 0–40)

## 2014-06-18 LAB — TSH: TSH: 2.029 u[IU]/mL (ref 0.350–4.500)

## 2014-06-18 NOTE — Patient Instructions (Addendum)
contrave Qsymia   Keeping You Healthy  Get These Tests 1. Blood Pressure- Have your blood pressure checked once a year by your health care provider.  Normal blood pressure is 120/80. 2. Weight- Have your body mass index (BMI) calculated to screen for obesity.  BMI is measure of body fat based on height and weight.  You can also calculate your own BMI at GravelBags.it. 3. Cholesterol- Have your cholesterol checked every 5 years starting at age 40 then yearly starting at age 36. 39. Chlamydia, HIV, and other sexually transmitted diseases- Get screened every year until age 71, then within three months of each new sexual provider. 5. Pap Smear- Every 1-3 years; discuss with your health care provider. 6. Mammogram- Every year starting at age 62  Take these medicines  Calcium with Vitamin D-Your body needs 1200 mg of Calcium each day and [phone] IU of Vitamin D daily.  Your body can only absorb 500 mg of Calcium at a time so Calcium must be taken in 2 or 3 divided doses throughout the day.  Multivitamin with folic acid- Once daily if it is possible for you to become pregnant.  Get these Immunizations  Gardasil-Series of three doses; prevents HPV related illness such as genital warts and cervical cancer.  Menactra-Single dose; prevents meningitis.  Tetanus shot- Every 10 years.  Flu shot-Every year.  Take these steps 1. Do not smoke-Your healthcare provider can help you quit.  For tips on how to quit go to www.smokefree.gov or call 1-800 QUITNOW. 2. Be physically active- Exercise 5 days a week for at least 30 minutes.  If you are not already physically active, start slow and gradually work up to 30 minutes of moderate physical activity.  Examples of moderate activity include walking briskly, dancing, swimming, bicycling, etc. 3. Breast Cancer- A self breast exam every month is important for early detection of breast cancer.  For more information and instruction on self breast  exams, ask your healthcare provider or https://www.patel.info/. 4. Eat a healthy diet- Eat a variety of healthy foods such as fruits, vegetables, whole grains, low fat milk, low fat cheeses, yogurt, lean meats, poultry and fish, beans, nuts, tofu, etc.  For more information go to www. Thenutritionsource.org 5. Drink alcohol in moderation- Limit alcohol intake to one drink or less per day. Never drink and drive. 6. Depression- Your emotional health is as important as your physical health.  If you're feeling down or losing interest in things you normally enjoy please talk to your healthcare provider about being screened for depression. 7. Dental visit- Brush and floss your teeth twice daily; visit your dentist twice a year. 8. Eye doctor- Get an eye exam at least every 2 years. 9. Helmet use- Always wear a helmet when riding a bicycle, motorcycle, rollerblading or skateboarding. 60. Safe sex- If you may be exposed to sexually transmitted infections, use a condom. 11. Seat belts- Seat belts can save your live; always wear one. 12. Smoke/Carbon Monoxide detectors- These detectors need to be installed on the appropriate level of your home. Replace batteries at least once a year. 13. Skin cancer- When out in the sun please cover up and use sunscreen 15 SPF or higher. 14. Violence- If anyone is threatening or hurting you, please tell your healthcare provider.

## 2014-06-19 ENCOUNTER — Other Ambulatory Visit: Payer: Self-pay | Admitting: Physician Assistant

## 2014-06-19 DIAGNOSIS — R739 Hyperglycemia, unspecified: Secondary | ICD-10-CM

## 2014-06-20 NOTE — Progress Notes (Signed)
  Subjective:     [name] is a 38 y.o. female and is here for a comprehensive physical exam. The patient reports problems - pt feels great. she is trying to lose weight. she was doing good with diet and exercise then she started building her new home. she is now so busy she is not working out and eating worse. [name]  History   Social History  . Marital Status: Married    Spouse Name: N/A    Number of Children: 1  . Years of Education: N/A   Occupational History  .  [employer]   Social History Main Topics  . Smoking status: Never Smoker   . Smokeless tobacco: Never Used  . Alcohol Use: No  . Drug Use: No  . Sexual Activity: Yes   Other Topics Concern  . Not on file   Social History Narrative   Health Maintenance  Topic Date Due  . MAMMOGRAM  08/22/1991  . INFLUENZA VACCINE  03/09/2015  . PAP SMEAR  08/08/2016  . TETANUS/TDAP  08/08/2020    The following portions of the patient's history were reviewed and updated as appropriate: allergies, current medications, past family history, past medical history, past social history, past surgical history and problem list.  Review of Systems A comprehensive review of systems was negative.   Objective:    BP 105/62 mmHg  Pulse 74  Ht 5' 7"  (1.702 m)  Wt 201 lb (91.173 kg)  BMI 31.47 kg/m2 General appearance: alert, cooperative and appears stated age Head: Normocephalic, without obvious abnormality, atraumatic Eyes: conjunctivae/corneas clear. PERRL, EOM's intact. Fundi benign. Ears: normal TM's and external ear canals both ears Nose: Nares normal. Septum midline. Mucosa normal. No drainage or sinus tenderness. Throat: lips, mucosa, and tongue normal; teeth and gums normal Neck: no adenopathy, no carotid bruit, no JVD, supple, symmetrical, trachea midline and thyroid not enlarged, symmetric, no tenderness/mass/nodules Back: symmetric, no curvature. ROM normal. No CVA tenderness. Lungs: clear to auscultation  bilaterally Heart: regular rate and rhythm, S1, S2 normal, no murmur, click, rub or gallop Abdomen: soft, non-tender; bowel sounds normal; no masses,  no organomegaly Extremities: extremities normal, atraumatic, no cyanosis or edema Pulses: 2+ and symmetric Skin: Skin color, texture, turgor normal. No rashes or lesions Lymph nodes: Cervical, supraclavicular, and axillary nodes normal. Neurologic: Grossly normal    Assessment:    Healthy female exam.      Plan:    CPE- ordered mammogram. Pap up to date. Tdap up to date. Flu shot up to date. Will get lipid and cmp and TSH. Continue vitamin D. Recommended 1236m of calcium daily.   Abnormal weight gain/obesity- discussed some weight loss medications such as qsymia and contrave. Tried and failed belviq. Pt will try diet and exercise on her own for another couple of months. If she is not having success will follow up.   Status post iridectomy- pt reported. Doing well no complications.  See After Visit Summary for Counseling Recommendations

## 2014-06-24 ENCOUNTER — Telehealth: Payer: Self-pay | Admitting: Physician Assistant

## 2014-06-24 ENCOUNTER — Ambulatory Visit: Payer: 59

## 2014-06-24 NOTE — Telephone Encounter (Signed)
Patient called adv that she would try to go to the lab today before 5pm but may end up going tomorrow. Thanks

## 2014-06-25 LAB — HEMOGLOBIN A1C
HEMOGLOBIN A1C: 4.9 % (ref <5.7)
MEAN PLASMA GLUCOSE: 94 mg/dL (ref <117)

## 2014-06-25 NOTE — Telephone Encounter (Signed)
Labs ordered.

## 2014-08-11 ENCOUNTER — Encounter: Payer: 59 | Admitting: Physician Assistant

## 2015-03-16 ENCOUNTER — Ambulatory Visit (INDEPENDENT_AMBULATORY_CARE_PROVIDER_SITE_OTHER): Payer: 59 | Admitting: Family Medicine

## 2015-03-16 ENCOUNTER — Encounter: Payer: Self-pay | Admitting: Family Medicine

## 2015-03-16 VITALS — BP 116/73 | HR 67 | Temp 97.9°F | Wt 205.0 lb

## 2015-03-16 DIAGNOSIS — N390 Urinary tract infection, site not specified: Secondary | ICD-10-CM | POA: Insufficient documentation

## 2015-03-16 DIAGNOSIS — R300 Dysuria: Secondary | ICD-10-CM

## 2015-03-16 DIAGNOSIS — N3001 Acute cystitis with hematuria: Secondary | ICD-10-CM

## 2015-03-16 LAB — POCT URINALYSIS DIPSTICK
Glucose, UA: 100
NITRITE UA: POSITIVE
Protein, UA: 100
SPEC GRAV UA: 1.02
UROBILINOGEN UA: 4
pH, UA: 5

## 2015-03-16 MED ORDER — CEPHALEXIN 500 MG PO CAPS: 500.0000 mg | ORAL_CAPSULE | Freq: Two times a day (BID) | ORAL | Status: DC

## 2015-03-16 NOTE — Assessment & Plan Note (Signed)
Probable UTI. Point-of-care urinalysis difficult to interpret due to AZO. Urine culture pending. Treat with Keflex

## 2015-03-16 NOTE — Patient Instructions (Signed)
Thank you for coming in today. If your belly pain worsens, or you have high fever, bad vomiting, blood in your stool or black tarry stool go to the Emergency Room.   Urinary Tract Infection Urinary tract infections (UTIs) can develop anywhere along your urinary tract. Your urinary tract is your body's drainage system for removing wastes and extra water. Your urinary tract includes two kidneys, two ureters, a bladder, and a urethra. Your kidneys are a pair of bean-shaped organs. Each kidney is about the size of your fist. They are located below your ribs, one on each side of your spine. CAUSES Infections are caused by microbes, which are microscopic organisms, including fungi, viruses, and bacteria. These organisms are so small that they can only be seen through a microscope. Bacteria are the microbes that most commonly cause UTIs. SYMPTOMS  Symptoms of UTIs may vary by age and gender of the patient and by the location of the infection. Symptoms in young women typically include a frequent and intense urge to urinate and a painful, burning feeling in the bladder or urethra during urination. Older women and men are more likely to be tired, shaky, and weak and have muscle aches and abdominal pain. A fever may mean the infection is in your kidneys. Other symptoms of a kidney infection include pain in your back or sides below the ribs, nausea, and vomiting. DIAGNOSIS To diagnose a UTI, your caregiver will ask you about your symptoms. Your caregiver also will ask to provide a urine sample. The urine sample will be tested for bacteria and white blood cells. White blood cells are made by your body to help fight infection. TREATMENT  Typically, UTIs can be treated with medication. Because most UTIs are caused by a bacterial infection, they usually can be treated with the use of antibiotics. The choice of antibiotic and length of treatment depend on your symptoms and the type of bacteria causing your  infection. HOME CARE INSTRUCTIONS  If you were prescribed antibiotics, take them exactly as your caregiver instructs you. Finish the medication even if you feel better after you have only taken some of the medication.  Drink enough water and fluids to keep your urine clear or pale yellow.  Avoid caffeine, tea, and carbonated beverages. They tend to irritate your bladder.  Empty your bladder often. Avoid holding urine for long periods of time.  Empty your bladder before and after sexual intercourse.  After a bowel movement, women should cleanse from front to back. Use each tissue only once. SEEK MEDICAL CARE IF:   You have back pain.  You develop a fever.  Your symptoms do not begin to resolve within 3 days. SEEK IMMEDIATE MEDICAL CARE IF:   You have severe back pain or lower abdominal pain.  You develop chills.  You have nausea or vomiting.  You have continued burning or discomfort with urination. MAKE SURE YOU:   Understand these instructions.  Will watch your condition.  Will get help right away if you are not doing well or get worse. Document Released: 05/04/2005 Document Revised: 01/24/2012 Document Reviewed: 09/02/2011 [facility] Patient Information 2015 [place], [place]. This information is not intended to replace advice given to you by your health care provider. Make sure you discuss any questions you have with your health care provider.

## 2015-03-16 NOTE — Progress Notes (Signed)
[name] is a 39 y.o. female who presents to [facility]  today for urinary frequency urgency and dysuria. Symptoms present for one day and are consistent with previous UTI. Patient has tried AZO which helps. No vomiting or diarrhea chest pain or abdominal pain.   Past Medical History  Diagnosis Date  . Heart murmur     during pregnancy  . Gestational diabetes   . Preeclampsia    Past Surgical History  Procedure Laterality Date  . Cesarean section    . Dilation and evacuation  04/23/2011    Procedure: DILATATION AND EVACUATION (D&E);  Surgeon: Floyce Stakes. Fogleman;  Location: [facility];  Service: Gynecology;  Laterality: N/A;  Ultrasound Guidance  . Laser iridotomy Bilateral May 2015   History  Substance Use Topics  . Smoking status: Never Smoker   . Smokeless tobacco: Never Used  . Alcohol Use: No   ROS as above Medications: Current Outpatient Prescriptions  Medication Sig Dispense Refill  . cephALEXin (KEFLEX) 500 MG capsule Take 1 capsule (500 mg total) by mouth 2 (two) times daily. 14 capsule 0   No current facility-administered medications for this visit.   Allergies  Allergen Reactions  . Belviq [Lorcaserin Hcl]     Crazy dreams     Exam:  BP 116/73 mmHg  Pulse 67  Temp(Src) 97.9 F (36.6 C) (Oral)  Wt 205 lb (92.987 kg) Gen: Well NAD HEENT: EOMI,  MMM Lungs: Normal work of breathing. CTABL Heart: RRR no MRG Abd: NABS, Soft. Nondistended, Nontender no CV angle tenderness to percussion Exts: Brisk capillary refill, warm and well perfused.   Results for orders placed or performed in visit on 03/16/15 (from the past 24 hour(s))  POCT Urinalysis Dipstick     Status: Abnormal   Collection Time: 03/16/15 11:02 AM  Result Value Ref Range   Color, UA red    Clarity, UA clear    Glucose, UA 100    Bilirubin, UA small    Ketones, UA trace    Spec Grav, UA 1.020    Blood, UA mod    pH, UA 5.0    Protein, UA 100    Urobilinogen, UA 4.0    Nitrite, UA pos    Leukocytes, UA large (3+) (A) Negative   No results found.   Please see individual assessment and plan sections.

## 2015-03-19 LAB — URINE CULTURE: Colony Count: >=100000

## 2015-03-20 ENCOUNTER — Encounter: Payer: Self-pay | Admitting: Family Medicine

## 2015-07-20 ENCOUNTER — Ambulatory Visit: Payer: 59 | Admitting: Osteopathic Medicine

## 2015-07-20 ENCOUNTER — Ambulatory Visit (INDEPENDENT_AMBULATORY_CARE_PROVIDER_SITE_OTHER): Payer: 59 | Admitting: Osteopathic Medicine

## 2015-07-20 ENCOUNTER — Encounter: Payer: Self-pay | Admitting: Osteopathic Medicine

## 2015-07-20 VITALS — BP 129/76 | HR 80 | Temp 98.1°F | Ht 68.0 in | Wt 214.0 lb

## 2015-07-20 DIAGNOSIS — J069 Acute upper respiratory infection, unspecified: Secondary | ICD-10-CM | POA: Diagnosis not present

## 2015-07-20 DIAGNOSIS — B9789 Other viral agents as the cause of diseases classified elsewhere: Principal | ICD-10-CM

## 2015-07-20 DIAGNOSIS — J019 Acute sinusitis, unspecified: Secondary | ICD-10-CM | POA: Diagnosis not present

## 2015-07-20 MED ORDER — AMOXICILLIN-POT CLAVULANATE 875-125 MG PO TABS: 1.0000 | ORAL_TABLET | Freq: Two times a day (BID) | ORAL | Status: DC

## 2015-07-20 MED ORDER — GUAIFENESIN-CODEINE 100-10 MG/5ML PO SYRP: 5.0000 mL | ORAL_SOLUTION | Freq: Three times a day (TID) | ORAL | Status: DC | PRN

## 2015-07-20 MED ORDER — BENZONATATE 200 MG PO CAPS: 200.0000 mg | ORAL_CAPSULE | Freq: Three times a day (TID) | ORAL | Status: DC | PRN

## 2015-07-20 NOTE — Progress Notes (Signed)
HPI: [name] is a 39 y.o. female who presents to [place] today for chief complaint of:  Chief Complaint  Patient presents with  . Cough    . Location: chest . Quality: coughing, started as sinus congestion, (+) productive coughing w/ green  . Severity:  Moderate.. . Duration: few weeks . Context: (+) sick contacts  . Modifying factors: started few nights ago NyQuil Cold & Flu, last night started Mucinex and Delsym  . Assoc signs/symptoms: no SOB, no fever/chills   Past medical, social and family history reviewed: Past Medical History  Diagnosis Date  . Heart murmur     during pregnancy  . Gestational diabetes   . Preeclampsia    Past Surgical History  Procedure Laterality Date  . Cesarean section    . Dilation and evacuation  04/23/2011    Procedure: DILATATION AND EVACUATION (D&E);  Surgeon: Floyce Stakes. Fogleman;  Location: [facility];  Service: Gynecology;  Laterality: N/A;  Ultrasound Guidance  . Laser iridotomy Bilateral May 2015   Social History  Substance Use Topics  . Smoking status: Never Smoker   . Smokeless tobacco: Never Used  . Alcohol Use: No   Family History  Problem Relation Age of Onset  . Sudden death Mother     Age 52; ? MI  . Coronary artery disease Father     Age 30  . Diabetes Father   . Hyperlipidemia Maternal Aunt   . Pulmonary fibrosis Maternal Aunt     No current outpatient prescriptions on file.   No current facility-administered medications for this visit.   Allergies  Allergen Reactions  . Belviq [Lorcaserin Hcl]     Crazy dreams      Review of Systems: CONSTITUTIONAL:  No  fever, no chills, No  unintentional weight changes HEAD/EYES/EARS/NOSE/THROAT: Yes  headache, no vision change, no hearing change, Yes  sore throat, Yes  sinus pressure CARDIAC: No  chest pain, No  pressure, No palpitations, No  orthopnea RESPIRATORY: Yes  cough, No  shortness of breath/wheeze GASTROINTESTINAL: No   nausea, No  vomiting, No  abdominal pain,    Exam:  BP 129/76 mmHg  Pulse 80  Temp(Src) 98.1 F (36.7 C) (Oral)  Ht 5' 8"  (1.727 m)  Wt 214 lb (97.07 kg)  BMI 32.55 kg/m2 Constitutional: VS see above. General Appearance: alert, well-developed, well-nourished, NAD Eyes: Normal lids and conjunctive, non-icteric sclera, PERRLA Ears, Nose, Mouth, Throat: MMM, Normal external inspection ears/nares/mouth/lips/gums, TM normal, posterior pharynx Yes  erythema No  exudate Neck: No masses, trachea midline. No thyroid enlargement/tenderness/mass appreciated. No lymphadenopathy Respiratory: Normal respiratory effort. no wheeze, no rhonchi, no rales Cardiovascular: S1/S2 normal, no murmur, no rub/gallop auscultated. RRR.  [name]    No results found for this or any previous visit (from the past 72 hour(s)).    ASSESSMENT/PLAN:   Prescriptions written as below, patient was given printed prescription for Augmentin to fill in the next 2-3 days if she is still not noticing any improvement. Lungs clear on exam but if systems persist may consider chest x-ray  Viral URI with cough - Plan: guaiFENesin-codeine (ROBITUSSIN AC) 100-10 MG/5ML syrup, benzonatate (TESSALON) 200 MG capsule  Subacute sinusitis, unspecified location - Plan: amoxicillin-clavulanate (AUGMENTIN) 875-125 MG tablet     Return if symptoms worsen or fail to improve.

## 2015-09-15 ENCOUNTER — Encounter: Payer: Self-pay | Admitting: Physician Assistant

## 2015-09-15 ENCOUNTER — Ambulatory Visit (INDEPENDENT_AMBULATORY_CARE_PROVIDER_SITE_OTHER): Payer: 59 | Admitting: Physician Assistant

## 2015-09-15 VITALS — BP 113/67 | HR 64 | Ht 68.0 in | Wt 209.0 lb

## 2015-09-15 DIAGNOSIS — E669 Obesity, unspecified: Secondary | ICD-10-CM

## 2015-09-15 DIAGNOSIS — Z13220 Encounter for screening for lipoid disorders: Secondary | ICD-10-CM

## 2015-09-15 DIAGNOSIS — Z0000 Encounter for general adult medical examination without abnormal findings: Secondary | ICD-10-CM

## 2015-09-15 DIAGNOSIS — R635 Abnormal weight gain: Secondary | ICD-10-CM

## 2015-09-15 DIAGNOSIS — Z114 Encounter for screening for human immunodeficiency virus [HIV]: Secondary | ICD-10-CM

## 2015-09-15 DIAGNOSIS — O24410 Gestational diabetes mellitus in pregnancy, diet controlled: Secondary | ICD-10-CM | POA: Diagnosis not present

## 2015-09-15 DIAGNOSIS — O24414 Gestational diabetes mellitus in pregnancy, insulin controlled: Secondary | ICD-10-CM

## 2015-09-15 DIAGNOSIS — Z1159 Encounter for screening for other viral diseases: Secondary | ICD-10-CM | POA: Diagnosis not present

## 2015-09-15 DIAGNOSIS — O99810 Abnormal glucose complicating pregnancy: Secondary | ICD-10-CM | POA: Diagnosis not present

## 2015-09-15 LAB — COMPLETE METABOLIC PANEL WITH GFR
ALT: 12 U/L (ref 6–29)
AST: 12 U/L (ref 10–30)
Albumin: 4.1 g/dL (ref 3.6–5.1)
Alkaline Phosphatase: 52 U/L (ref 33–115)
BUN: 13 mg/dL (ref 7–25)
CHLORIDE: 105 mmol/L (ref 98–110)
CO2: 27 mmol/L (ref 20–31)
Calcium: 8.8 mg/dL (ref 8.6–10.2)
Creat: 0.74 mg/dL (ref 0.50–1.10)
GFR, Est African American: >89 mL/min (ref >=60)
GLUCOSE: 90 mg/dL (ref 65–99)
POTASSIUM: 4.3 mmol/L (ref 3.5–5.3)
SODIUM: 140 mmol/L (ref 135–146)
Total Bilirubin: 0.8 mg/dL (ref 0.2–1.2)
Total Protein: 6.5 g/dL (ref 6.1–8.1)

## 2015-09-15 LAB — LIPID PANEL
CHOL/HDL RATIO: 3.6 ratio (ref <=5.0)
Cholesterol: 162 mg/dL (ref 125–200)
HDL: 45 mg/dL — AB (ref >=46)
LDL CALC: 104 mg/dL (ref <130)
Triglycerides: 63 mg/dL (ref <150)
VLDL: 13 mg/dL (ref <30)

## 2015-09-15 LAB — TSH: TSH: 2.5 mIU/L

## 2015-09-15 MED ORDER — PHENTERMINE HCL 37.5 MG PO TABS: 37.5000 mg | ORAL_TABLET | Freq: Every day | ORAL | Status: DC

## 2015-09-15 MED FILL — PHENTERMINE 37.5 MG TABLET: 37.5 | 30 days supply | Qty: 30 | Fill #0

## 2015-09-15 NOTE — Patient Instructions (Signed)
Keeping You Healthy  Get These Tests 1. Blood Pressure- Have your blood pressure checked once a year by your health care provider.  Normal blood pressure is 120/80. 2. Weight- Have your body mass index (BMI) calculated to screen for obesity.  BMI is measure of body fat based on height and weight.  You can also calculate your own BMI at GravelBags.it. 3. Cholesterol- Have your cholesterol checked every 5 years starting at age 42 then yearly starting at age 70. 14. Chlamydia, HIV, and other sexually transmitted diseases- Get screened every year until age 83, then within three months of each new sexual provider. 5. Pap Test - Every 1-5 years; discuss with your health care provider. 6. Mammogram- Every 1-2 years starting at age 24--50  Take these medicines  Calcium with Vitamin D-Your body needs 1200 mg of Calcium each day and [phone] IU of Vitamin D daily.  Your body can only absorb 500 mg of Calcium at a time so Calcium must be taken in 2 or 3 divided doses throughout the day.  Multivitamin with folic acid- Once daily if it is possible for you to become pregnant.  Get these Immunizations  Gardasil-Series of three doses; prevents HPV related illness such as genital warts and cervical cancer.  Menactra-Single dose; prevents meningitis.  Tetanus shot- Every 10 years.  Flu shot-Every year.  Take these steps 1. Do not smoke-Your healthcare provider can help you quit.  For tips on how to quit go to www.smokefree.gov or call 1-800 QUITNOW. 2. Be physically active- Exercise 5 days a week for at least 30 minutes.  If you are not already physically active, start slow and gradually work up to 30 minutes of moderate physical activity.  Examples of moderate activity include walking briskly, dancing, swimming, bicycling, etc. 3. Breast Cancer- A self breast exam every month is important for early detection of breast cancer.  For more information and instruction on self breast exams, ask your  healthcare provider or https://www.patel.info/. 4. Eat a healthy diet- Eat a variety of healthy foods such as fruits, vegetables, whole grains, low fat milk, low fat cheeses, yogurt, lean meats, poultry and fish, beans, nuts, tofu, etc.  For more information go to www. Thenutritionsource.org 5. Drink alcohol in moderation- Limit alcohol intake to one drink or less per day. Never drink and drive. 6. Depression- Your emotional health is as important as your physical health.  If you're feeling down or losing interest in things you normally enjoy please talk to your healthcare provider about being screened for depression. 7. Dental visit- Brush and floss your teeth twice daily; visit your dentist twice a year. 8. Eye doctor- Get an eye exam at least every 2 years. 9. Helmet use- Always wear a helmet when riding a bicycle, motorcycle, rollerblading or skateboarding. 41. Safe sex- If you may be exposed to sexually transmitted infections, use a condom. 11. Seat belts- Seat belts can save your live; always wear one. 12. Smoke/Carbon Monoxide detectors- These detectors need to be installed on the appropriate level of your home. Replace batteries at least once a year. 13. Skin cancer- When out in the sun please cover up and use sunscreen 15 SPF or higher. 14. Violence- If anyone is threatening or hurting you, please tell your healthcare provider.

## 2015-09-15 NOTE — Progress Notes (Addendum)
Subjective:    Patient ID: [name], female    DOB: 1976-02-24, 40 y.o.   MRN: [id number]  HPI  Patient is a 40 year old female presenting for a complete physical. Patient states that she has been happy and experiences less stress in her life now that she has moved into her new house. Patient has recently started Weight Watchers with a friend from work and is trying to exercise three times a week. Patient is also trying to improve her diet. Patient expresses concern regarding her family history, as her father had diabetes and her mother had heart disease. Patient states that she had a pap smear and a mammogram in 2016. Patient states that she has experienced some congestion.   .. Active Ambulatory Problems    Diagnosis Date Noted  . CONJUNCTIVITIS, VIRAL, LEFT 11/03/2009  . URI 11/03/2009  . Palpitations 11/24/2010  . Murmur 11/24/2010  . Dyspnea 11/24/2010  . 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 UTI (urinary tract infection) 03/16/2015   Resolved Ambulatory Problems    Diagnosis Date Noted  . No Resolved Ambulatory Problems   Past Medical History  Diagnosis Date  . Heart murmur   . Gestational diabetes   . Preeclampsia    .[name] Family History  Problem Relation Age of Onset  . Sudden death Mother     Age 80; ? MI  . Coronary artery disease Father     Age 57  . Diabetes Father   . Hyperlipidemia Maternal Aunt   . Pulmonary fibrosis Maternal Aunt    .[name] Social History   Social History  . Marital Status: Married    Spouse Name: N/A  . Number of Children: 1  . Years of Education: N/A   Occupational History  .  [employer]   Social History Main Topics  . Smoking status: Never Smoker   . Smokeless tobacco: Never Used  . Alcohol Use: No  . Drug Use: No  . Sexual Activity: Yes   Other Topics Concern  . Not on file   Social History Narrative     Review of Systems   Please see HPI  Objective:   Physical Exam  Constitutional: She is oriented to  person, place, and time. She appears well-developed and well-nourished. No distress.  HENT:  Head: Normocephalic and atraumatic.  Right Ear: External ear normal.  Left Ear: External ear normal.  Nose: Nose normal.  Mouth/Throat: Oropharynx is clear and moist.  Eyes: Conjunctivae and EOM are normal. Pupils are equal, round, and reactive to light. Right eye exhibits no discharge. Left eye exhibits no discharge.  Neck: Normal range of motion. Neck supple. No tracheal deviation present. No thyromegaly present.  Cardiovascular: Normal rate, regular rhythm, normal heart sounds and intact distal pulses.   No murmur heard. Pulmonary/Chest: Effort normal and breath sounds normal. No respiratory distress. She has no wheezes. She has no rales.  Abdominal: Soft. Bowel sounds are normal. She exhibits no distension. There is no tenderness. There is no rebound and no guarding.  Musculoskeletal: Normal range of motion.  Lymphadenopathy:    She has no cervical adenopathy.  Neurological: She is alert and oriented to person, place, and time. No cranial nerve deficit.  Skin: Skin is warm and dry. She is not diaphoretic.  Psychiatric: She has a normal mood and affect. Her behavior is normal. Judgment and thought content normal.      Assessment & Plan:  CPE- will get results of pap and mammogram.  Lipid ordered. Encouraged vitamin D 800 units and Calcium 1562m daily. See weight discussion below.   Impaired fasting glucose-  Patient had a fasting CMP in 2016 with a blood glucose of 106 that was followed up by a Hemoglobin A1C (4.9). Patient will repeat CMP and Hemoglobin A1C today.   Healthy Lifestyle/abnoral weight gain -  Discussed with patient ways to vary daily exercise habits to keep routine from becoming boring and mundane. Started phentermine. Discussed side effects.(murmur was only in pregnancy, will continue to follow. Any SOB pt instructed to stop) Follow up in 1 month. Continue with weight watchers.

## 2015-09-16 ENCOUNTER — Encounter: Payer: 59 | Admitting: Physician Assistant

## 2015-09-16 ENCOUNTER — Encounter: Payer: Self-pay | Admitting: Physician Assistant

## 2015-09-16 LAB — HIV ANTIBODY (ROUTINE TESTING W REFLEX): HIV 1&2 Ab, 4th Generation: NONREACTIVE

## 2015-09-16 LAB — HEMOGLOBIN A1C
HEMOGLOBIN A1C: 5 % (ref <5.7)
MEAN PLASMA GLUCOSE: 97 mg/dL (ref <117)

## 2015-09-18 DIAGNOSIS — R635 Abnormal weight gain: Secondary | ICD-10-CM | POA: Insufficient documentation

## 2015-09-18 DIAGNOSIS — O24419 Gestational diabetes mellitus in pregnancy, unspecified control: Secondary | ICD-10-CM | POA: Insufficient documentation

## 2015-10-14 ENCOUNTER — Ambulatory Visit (INDEPENDENT_AMBULATORY_CARE_PROVIDER_SITE_OTHER): Payer: 59 | Admitting: Osteopathic Medicine

## 2015-10-14 ENCOUNTER — Encounter: Payer: Self-pay | Admitting: Osteopathic Medicine

## 2015-10-14 VITALS — BP 126/78 | HR 104 | Temp 100.2°F | Ht 68.0 in | Wt 207.0 lb

## 2015-10-14 DIAGNOSIS — R110 Nausea: Secondary | ICD-10-CM

## 2015-10-14 DIAGNOSIS — R059 Cough, unspecified: Secondary | ICD-10-CM

## 2015-10-14 DIAGNOSIS — R05 Cough: Secondary | ICD-10-CM | POA: Diagnosis not present

## 2015-10-14 DIAGNOSIS — R69 Illness, unspecified: Principal | ICD-10-CM

## 2015-10-14 DIAGNOSIS — J111 Influenza due to unidentified influenza virus with other respiratory manifestations: Secondary | ICD-10-CM

## 2015-10-14 LAB — POCT INFLUENZA A/B
Influenza A, POC: NEGATIVE
Influenza B, POC: NEGATIVE

## 2015-10-14 MED ORDER — OSELTAMIVIR PHOSPHATE 75 MG PO CAPS: 75.0000 mg | ORAL_CAPSULE | Freq: Two times a day (BID) | ORAL | Status: DC

## 2015-10-14 MED ORDER — ONDANSETRON 8 MG PO TBDP: 8.0000 mg | ORAL_TABLET | Freq: Three times a day (TID) | ORAL | Status: DC | PRN

## 2015-10-14 MED ORDER — GUAIFENESIN-CODEINE 100-10 MG/5ML PO SOLN: 5.0000 mL | Freq: Four times a day (QID) | ORAL | Status: DC | PRN

## 2015-10-14 MED FILL — OSELTAMIVIR PHOS 75 MG CAP: 75 | 5 days supply | Qty: 10 | Fill #0

## 2015-10-14 MED FILL — ONDANSETRON ODT 8 MG TABLET: 8 | 7 days supply | Qty: 20 | Fill #0

## 2015-10-14 NOTE — Patient Instructions (Signed)
Let us know if your family starts to get sick with similar symptoms and we can send in treatment for them as long as they are patients of this clinic. If you, yourself, are getting worse or have other concerns, let us know. Ok to continue Tylenol/Ibuprofen. Rest and hydrate! Handwashing and sanitizing commonly touched household surfaces will help prevent spread of germs.

## 2015-10-14 NOTE — Progress Notes (Signed)
HPI: [name] is a 40 y.o. female who presents to [place]  today for chief complaint of:  Chief Complaint  Patient presents with  . Cough   ?FLU  . Quality: fever measured up to 102 . Assoc signs/symptoms: see ROS . Duration: 1 days . Modifying factors: has tried the following OTC/Rx medications: Tylenol, Ibuprofen, NyQuil    Past medical, social and family history reviewed. Current medications and allergies reviewed.     Review of Systems: CONSTITUTIONAL: yes fever/chills HEAD/EYES/EARS/NOSE/THROAT: yes headache, no vision change or hearing change, yes sore throat CARDIAC: No chest pain/pressure/palpitations RESPIRATORY: yes cough, no shortness of breath GASTROINTESTINAL: yes nausea, no vomiting, no abdominal pain, no diarrhea MUSCULOSKELETAL: yes myalgia/arthralgia   Exam:  BP 126/78 mmHg  Pulse 104  Temp(Src) 100.2 F (37.9 C) (Oral)  Ht 5' 8"  (1.727 m)  Wt 207 lb (93.895 kg)  BMI 31.48 kg/m2 Constitutional: VSS, see above. General Appearance: alert, well-developed, well-nourished, NAD Eyes: Normal lids and conjunctive, non-icteric sclera,  Ears, Nose, Mouth, Throat: Normal external inspection ears/nares/mouth/lips/gums, normal TM, MMM;       posterior pharynx with erythema, without exudate Neck: No masses, trachea midline. sore but not enlarged lymph nodes Respiratory: Normal respiratory effort. No  wheeze/rhonchi/rales Cardiovascular: S1/S2 normal, no murmur/rub/gallop auscultated. Mild tachycardic, reg rhythm.   Results for orders placed or performed in visit on 10/14/15 (from the past 72 hour(s))  POCT Influenza A/B     Status: None   Collection Time: 10/14/15  2:51 PM  Result Value Ref Range   Influenza A, POC Negative Negative   Influenza B, POC Negative Negative   No results found.   ASSESSMENT/PLAN: Can't confirm flu, family is healthy and no high-risk medical problems in family, will hold off on ppx for  family but let us know if they atart to get sick with flu-like illness as well.   Influenza-like illness - Plan: oseltamivir (TAMIFLU) 75 MG capsule  Cough - Plan: POCT Influenza A/B, guaiFENesin-codeine 100-10 MG/5ML syrup  Nausea without vomiting - Plan: ondansetron (ZOFRAN-ODT) 8 MG disintegrating tablet    Return if symptoms worsen or fail to improve.

## 2015-12-11 DIAGNOSIS — Z1231 Encounter for screening mammogram for malignant neoplasm of breast: Secondary | ICD-10-CM | POA: Diagnosis not present

## 2015-12-11 LAB — HM MAMMOGRAPHY

## 2015-12-21 ENCOUNTER — Encounter: Payer: Self-pay | Admitting: Physician Assistant

## 2016-02-10 MED FILL — GENTAMICIN 3 MG/ML EYE DRP: 0.3 | 10 days supply | Qty: 5 | Fill #0

## 2016-02-15 DIAGNOSIS — H1045 Other chronic allergic conjunctivitis: Secondary | ICD-10-CM | POA: Diagnosis not present

## 2016-02-15 MED FILL — LOTEMAX 0.5% EYE DROPS: 0.5 | 13 days supply | Qty: 5 | Fill #0

## 2016-02-24 DIAGNOSIS — H1045 Other chronic allergic conjunctivitis: Secondary | ICD-10-CM | POA: Diagnosis not present

## 2016-03-01 MED FILL — PAZEO 0.7% EYE DROPS: 0.7 | 30 days supply | Qty: 3 | Fill #0

## 2016-04-28 DIAGNOSIS — L42 Pityriasis rosea: Secondary | ICD-10-CM | POA: Diagnosis not present

## 2016-04-28 MED FILL — TRIAMCINOLONE 0.1% CREAM: 0.1 | 20 days supply | Qty: 80 | Fill #0

## 2016-06-16 NOTE — Progress Notes (Signed)
[name] [address], [place] [address] Phone: [phone] Subjective:    I'm seeing this patient by the request  of:  Iran Planas, PA-C   CC: Right hip pain  OIN:[id number]  [name] is a 40 y.o. female coming in with complaint of right hip pain.  Patient was running. Felt pain immediately when going up hill. More on the lateral aspect of the hip. Describes the pain as a 6 out of 10 pain. Patient states that it hurts her more when going from a seated to standing position. Can do daily activities but has some discomfort. Anytime she runs it seems to aggravate her more. Denies any groin pain significantly with it. Denies any nighttime awakening. No radiation of pain. Does respond to over-the-counter anti-inflammatories.      Past Medical History:  Diagnosis Date  . Gestational diabetes   . Heart murmur    during pregnancy  . Preeclampsia    Past Surgical History:  Procedure Laterality Date  . CESAREAN SECTION    . DILATION AND EVACUATION  04/23/2011   Procedure: DILATATION AND EVACUATION (D&E);  Surgeon: Floyce Stakes. Fogleman;  Location: [facility];  Service: Gynecology;  Laterality: N/A;  Ultrasound Guidance  . laser iridotomy Bilateral May 2015   Social History   Social History  . Marital status: Married    Spouse name: N/A  . Number of children: 1  . Years of education: N/A   Occupational History  .  [employer]   Social History Main Topics  . Smoking status: Never Smoker  . Smokeless tobacco: Never Used  . Alcohol use No  . Drug use: No  . Sexual activity: Yes   Other Topics Concern  . None   Social History Narrative  . None   Allergies  Allergen Reactions  . Belviq [Lorcaserin Hcl]     Crazy dreams   Family History  Problem Relation Age of Onset  . Sudden death Mother     Age 22; ? MI  . Coronary artery disease Father     Age 61  . Diabetes Father   . Hyperlipidemia Maternal Aunt   . Pulmonary fibrosis  Maternal Aunt     Past medical history, social, surgical and family history all reviewed in electronic medical record.  No pertanent information unless stated regarding to the chief complaint.   Review of Systems:Review of systems updated and as accurate as of 06/17/16  No headache, visual changes, nausea, vomiting, diarrhea, constipation, dizziness, abdominal pain, skin rash, fevers, chills, night sweats, weight loss, swollen lymph nodes, body aches, joint swelling, muscle aches, chest pain, shortness of breath, mood changes.   Objective  Blood pressure 112/72, pulse 80, height 5' 8"  (1.727 m), weight 207 lb (93.9 kg), SpO2 97 %. Systems examined below as of 06/17/16   General: No apparent distress alert and oriented x3 mood and affect normal, dressed appropriately.  HEENT: Pupils equal, extraocular movements intact  Respiratory: Patient's speak in full sentences and does not appear short of breath  Cardiovascular: No lower extremity edema, non tender, no erythema  Skin: Warm dry intact with no signs of infection or rash on extremities or on axial skeleton.  Abdomen: Soft nontender  Neuro: Cranial nerves II through XII are intact, neurovascularly intact in all extremities with 2+ DTRs and 2+ pulses.  Lymph: No lymphadenopathy of posterior or anterior cervical chain or axillae bilaterally.  Gait normal with good balance and coordination.  MSK:  Non tender with full range of motion and good stability and symmetric strength and tone of shoulders, elbows, wrist,  knee and ankles bilaterally.   Hip: Right ROM IR: 25 Deg, ER: 45 Deg, Flexion: 120 Deg, Extension: 100 Deg, Abduction: 45 Deg, Adduction: 45 Deg Strength IR: 5/5, ER: 5/5, Flexion: 4/5 with mild weakness from the lateral side, Extension: 5/5, Abduction: 5/5, Adduction: 5/5 Pelvic alignment unremarkable to inspection and palpation. Standing hip rotation and gait without trendelenburg sign / unsteadiness. Greater trochanter without  tenderness to palpation. No tenderness over piriformis and greater trochanter. Tenderness with stressing the hip flexor No SI joint tenderness and normal minimal SI movement. Contralateral hip unremarkable  Procedure note 97110; 15 minutes spent for Therapeutic exercises as stated in above notes.  This included exercises focusing on stretching, strengthening, with significant focus on eccentric aspects. Hip strengthening exercises which included:  Pelvic tilt/bracing to help with proper recruitment of the lower abs and pelvic floor muscles  Glute strengthening to properly contract glutes without over-engaging low back and hamstrings - prone hip extension and glute bridge exercises Proper stretching techniques to increase effectiveness for the hip flexors, groin, quads, piriformic and low back when appropriate    Proper technique shown and discussed handout in great detail with ATC.  All questions were discussed and answered.      Impression and Recommendations:     This case required medical decision making of moderate complexity.      Note: This dictation was prepared with Dragon dictation along with smaller phrase technology. Any transcriptional errors that result from this process are unintentional.

## 2016-06-17 ENCOUNTER — Ambulatory Visit (INDEPENDENT_AMBULATORY_CARE_PROVIDER_SITE_OTHER): Payer: 59 | Admitting: Family Medicine

## 2016-06-17 ENCOUNTER — Encounter: Payer: Self-pay | Admitting: Family Medicine

## 2016-06-17 DIAGNOSIS — M76891 Other specified enthesopathies of right lower limb, excluding foot: Secondary | ICD-10-CM | POA: Insufficient documentation

## 2016-06-17 MED ORDER — VITAMIN D (ERGOCALCIFEROL) 1.25 MG (50000 UNIT) PO CAPS: 50000.0000 [IU] | ORAL_CAPSULE | ORAL | 0 refills | Status: DC

## 2016-06-17 MED ORDER — DICLOFENAC SODIUM 2 % TD SOLN: 2.0000 "application " | Freq: Two times a day (BID) | TRANSDERMAL | 3 refills | Status: DC

## 2016-06-17 MED FILL — VIT D2 1.25 MG (50,000 UNIT: 1.25 MG | 84 days supply | Qty: 12 | Fill #0

## 2016-06-17 NOTE — Patient Instructions (Signed)
Good to see you.  Ice 20 minutes 2 times daily. Usually after activity and before bed. pennsaid pinkie amount topically 2 times daily as needed.  Once weekly vitamin D for  12 weeks.  Keep wearing good shoes.  I would cross train more with biking or elliptical  See me again in 4 weeks.

## 2016-06-17 NOTE — Assessment & Plan Note (Signed)
She does have tenderness to palpation over the hip flexor itself. We discussed icing regimen and home exercises. We discussed which activities doing which ones to avoid. Work with Product/process development scientist. Patient will do once weekly vitamin D for muscle strength and endurance. Patient also will be started on topical anti-inflammatory's. Icing protocol discussed as well as proper shoes. Follow-up again in 4 weeks.

## 2016-07-15 ENCOUNTER — Ambulatory Visit: Payer: 59 | Admitting: Family Medicine

## 2016-08-05 ENCOUNTER — Emergency Department
Admission: EM | Admit: 2016-08-05 | Discharge: 2016-08-05 | Disposition: A | Discharge status: Home / Self Care | Payer: 59 | Source: Home / Self Care | Attending: Emergency Medicine | Admitting: Emergency Medicine

## 2016-08-05 ENCOUNTER — Encounter: Payer: Self-pay | Admitting: Emergency Medicine

## 2016-08-05 DIAGNOSIS — N3001 Acute cystitis with hematuria: Secondary | ICD-10-CM | POA: Diagnosis not present

## 2016-08-05 LAB — POCT URINALYSIS DIP (MANUAL ENTRY)
Bilirubin, UA: NEGATIVE
Glucose, UA: NEGATIVE
Ketones, POC UA: NEGATIVE
Nitrite, UA: POSITIVE — AB
Spec Grav, UA: 1.025 (ref 1.005–1.03)
Urobilinogen, UA: NEGATIVE (ref 0–1)
pH, UA: 6 (ref 5–8)

## 2016-08-05 MED ORDER — PHENAZOPYRIDINE HCL 200 MG PO TABS: ORAL_TABLET | ORAL | 0 refills | Status: DC

## 2016-08-05 MED ORDER — CIPROFLOXACIN HCL 500 MG PO TABS: 500.0000 mg | ORAL_TABLET | Freq: Two times a day (BID) | ORAL | 0 refills | Status: DC

## 2016-08-05 MED FILL — PHENAZOPYRIDINE 200 MG TAB: 200 | 2 days supply | Qty: 8 | Fill #0

## 2016-08-05 MED FILL — CIPROFLOXACIN HCL 500 MG TA: 500 | 7 days supply | Qty: 14 | Fill #0

## 2016-08-05 NOTE — ED Triage Notes (Signed)
Pt c/o urinary frequency and urgency for the last 2 days. She has tried cranberry juice.

## 2016-08-05 NOTE — ED Provider Notes (Signed)
[name]    CSN: [id number] Arrival date & time: 08/05/16  1615     History   Chief Complaint Chief Complaint  Patient presents with  . Urinary Tract Infection    HPI [name] is a 40 y.o. female.   HPI  This is a 40 y.o. female who presents today with UTI symptoms for 2 days.  + Minimal dysuria + Severe frequency + Severe urgency Mild hematuria No vaginal discharge No fever/chills, but may have had low-grade fever yesterday Minimal suprapubic lower abdominal pain No nausea No vomiting No back pain No fatigue She denies chance of pregnancy.-LMP normal 4 days ago Has tried over-the-counter measures, such as cranberry juice, without improvement.  Last UTI was 6 months ago, effectively treated with antibiotic    Past Medical History:  Diagnosis Date  . Gestational diabetes   . Heart murmur    during pregnancy  . Preeclampsia     Patient Active Problem List   Diagnosis Date Noted  . Hip flexor tendinitis, right 06/17/2016  . Abnormal weight gain 09/18/2015  . GDM (gestational diabetes mellitus) 09/18/2015  . 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 Palpitations 11/24/2010  . Murmur 11/24/2010  . Dyspnea 11/24/2010    Past Surgical History:  Procedure Laterality Date  . CESAREAN SECTION    . DILATION AND EVACUATION  04/23/2011   Procedure: DILATATION AND EVACUATION (D&E);  Surgeon: Floyce Stakes. Fogleman;  Location: [facility];  Service: Gynecology;  Laterality: N/A;  Ultrasound Guidance  . laser iridotomy Bilateral May 2015    OB History    No data available       Home Medications    Prior to Admission medications   Medication Sig Start Date End Date Taking? Authorizing Provider  ciprofloxacin (CIPRO) 500 MG tablet Take 1 tablet (500 mg total) by mouth 2 (two) times daily. For 7 days 08/05/16   Jacqulyn Cane, MD  Diclofenac Sodium (PENNSAID) 2 % SOLN Place 2 application onto the skin 2 (two) times daily. 06/17/16   Lyndal Pulley, DO  phenazopyridine (PYRIDIUM) 200 MG tablet Take 1 tablet by mouth every 6-8 hours if needed for urinary pain 08/05/16   Jacqulyn Cane, MD  Vitamin D, Ergocalciferol, (DRISDOL) 50000 units CAPS capsule Take 1 capsule (50,000 Units total) by mouth every 7 (seven) days. 06/17/16   Lyndal Pulley, DO    Family History Family History  Problem Relation Age of Onset  . Sudden death Mother     Age 37; ? MI  . Coronary artery disease Father     Age 44  . Diabetes Father   . Hyperlipidemia Maternal Aunt   . Pulmonary fibrosis Maternal Aunt     Social History Social History  Substance Use Topics  . Smoking status: Never Smoker  . Smokeless tobacco: Never Used  . Alcohol use No     Allergies   Belviq [lorcaserin hcl]   Review of Systems Review of Systems  All other systems reviewed and are negative.    Physical Exam Triage Vital Signs ED Triage Vitals  Enc Vitals Group     BP      Pulse      Resp      Temp      Temp src      SpO2      Weight      Height      Head Circumference      Peak Flow  Pain Score      Pain Loc      Pain Edu?      Excl. in [place]?    No data found.   Updated Vital Signs BP 143/88 (BP Location: Right Arm)   Pulse 88   Temp 97.4 F (36.3 C) (Oral)   Wt 211 lb (95.7 kg)   SpO2 97%   BMI 32.08 kg/m   Visual Acuity Right Eye Distance:   Left Eye Distance:   Bilateral Distance:    Right Eye Near:   Left Eye Near:    Bilateral Near:     Physical Exam  Constitutional: She is oriented to person, place, and time. She appears well-developed and well-nourished. No distress.  HENT:  Mouth/Throat: Oropharynx is clear and moist.  Eyes: No scleral icterus.  Neck: Neck supple.  Cardiovascular: Normal rate, regular rhythm and normal heart sounds.   Pulmonary/Chest: Breath sounds normal.  Abdominal: Soft. She exhibits no mass. There is no hepatosplenomegaly. There is tenderness (Very mild) in the suprapubic area. There is no  rebound, no guarding and no CVA tenderness.  Lymphadenopathy:    She has no cervical adenopathy.  Neurological: She is alert and oriented to person, place, and time.  Skin: Skin is warm and dry. No rash noted.  Nursing note and vitals reviewed.    UC Treatments / Results  Labs (all labs ordered are listed, but only abnormal results are displayed) Labs Reviewed  POCT URINALYSIS DIP (MANUAL ENTRY) - Abnormal; Notable for the following:       Result Value   Clarity, UA cloudy (*)    Blood, UA large (*)    Protein Ur, POC trace (*)    Nitrite, UA Positive (*)    Leukocytes, UA small (1+) (*)    All other components within normal limits  URINE CULTURE    EKG  EKG Interpretation None       Radiology No results found.  Procedures Procedures (including critical care time)  Medications Ordered in UC Medications - No data to display   Initial Impression / Assessment and Plan / UC Course  I have reviewed the triage vital signs and the nursing notes.  Pertinent labs & imaging results that were available during my care of the patient were reviewed by me and considered in my medical decision making (see chart for details).  Clinical Course       Final Clinical Impressions(s) / UC Diagnoses   Final diagnoses:  Acute cystitis with hematuria   Treatment options discussed, as well as risks, benefits, alternatives. Patient voiced understanding and agreement with the following plans:  New Prescriptions Discharge Medication List as of 08/05/2016  5:01 PM    START taking these medications   Details  ciprofloxacin (CIPRO) 500 MG tablet Take 1 tablet (500 mg total) by mouth 2 (two) times daily. For 7 days, Starting Fri 08/05/2016, Normal    phenazopyridine (PYRIDIUM) 200 MG tablet Take 1 tablet by mouth every 6-8 hours if needed for urinary pain, Normal      Urine culture Other symptomatic care discussed Follow-up with your primary care doctor in 5-7 days if not  improving, or sooner if symptoms become worse. Precautions discussed. Red flags discussed. Questions invited and answered. Patient voiced understanding and agreement.    Jacqulyn Cane, MD 08/05/16 [phone]

## 2016-08-08 ENCOUNTER — Telehealth: Payer: Self-pay | Admitting: *Deleted

## 2016-08-08 LAB — URINE CULTURE

## 2016-08-08 NOTE — Telephone Encounter (Signed)
Callback: Patient advised of UCX results, encouraged to complete antibiotics.

## 2016-09-06 ENCOUNTER — Telehealth: Payer: Self-pay

## 2016-09-06 MED ORDER — OSELTAMIVIR PHOSPHATE 75 MG PO CAPS: 75.0000 mg | ORAL_CAPSULE | Freq: Every day | ORAL | 0 refills | Status: DC

## 2016-09-06 MED FILL — OSELTAMIVIR PHOS 75 MG CAP: 75 | 10 days supply | Qty: 10 | Fill #0

## 2016-09-06 NOTE — Telephone Encounter (Signed)
Rx sent, Pt advised.

## 2016-09-06 NOTE — Telephone Encounter (Signed)
[name] called and stated her daughter is positive for the flu. She would like tamiflu sent in. She doesn't have any symptoms of the flu.

## 2016-09-06 NOTE — Telephone Encounter (Signed)
Ok to send tamiflu 33m take one tablet daily for 10 days #10 NRF

## 2016-10-13 NOTE — Progress Notes (Signed)
[name] [address] [place], [place] [address] Phone: [phone] Subjective:    CC: Right shoulder pain  [id number]  [name] is a 41 y.o. female coming in with complaint of right shoulder pain. Patient describes the pain as a dull, throbbing aching pain. Seems to be more on the scapular area. Patient denies any radiation down the arm. An states that it can wake her up at night. Certain movements can cause a severe pain as well. Rates the severity of pain a 7 out of 10. Denies any weakness. Patient states that it has been affecting daily activities to a certain degree. Does not really rates her injury. Does do a lot of lifting with her job.     Past Medical History:  Diagnosis Date  . Gestational diabetes   . Heart murmur    during pregnancy  . Preeclampsia    Past Surgical History:  Procedure Laterality Date  . CESAREAN SECTION    . DILATION AND EVACUATION  04/23/2011   Procedure: DILATATION AND EVACUATION (D&E);  Surgeon: Floyce Stakes. Fogleman;  Location: [facility];  Service: Gynecology;  Laterality: N/A;  Ultrasound Guidance  . laser iridotomy Bilateral May 2015   Social History   Social History  . Marital status: Married    Spouse name: N/A  . Number of children: 1  . Years of education: N/A   Occupational History  .     Social History Main Topics  . Smoking status: Never Smoker  . Smokeless tobacco: Never Used  . Alcohol use No  . Drug use: No  . Sexual activity: Yes   Other Topics Concern  . None   Social History Narrative  . None   Allergies  Allergen Reactions  . Belviq [Lorcaserin Hcl]     Crazy dreams   Family History  Problem Relation Age of Onset  . Sudden death Mother     Age 4; ? MI  . Coronary artery disease Father     Age 5  . Diabetes Father   . Hyperlipidemia Maternal Aunt   . Pulmonary fibrosis Maternal Aunt     Past medical history, social, surgical and family history all reviewed  in electronic medical record.  No pertanent information unless stated regarding to the chief complaint.   Review of Systems:Review of systems updated and as accurate as of 10/14/16  No headache, visual changes, nausea, vomiting, diarrhea, constipation, dizziness, abdominal pain, skin rash, fevers, chills, night sweats, weight loss, swollen lymph nodes, body aches, joint swelling, muscle aches, chest pain, shortness of breath, mood changes.   Objective  Blood pressure 124/80, pulse 72, height 5' 8"  (1.727 m), weight 200 lb (90.7 kg), last menstrual period 10/05/2016, SpO2 99 %. Systems examined below as of 10/14/16   General: No apparent distress alert and oriented x3 mood and affect normal, dressed appropriately.  HEENT: Pupils equal, extraocular movements intact  Respiratory: Patient's speak in full sentences and does not appear short of breath  Cardiovascular: No lower extremity edema, non tender, no erythema  Skin: Warm dry intact with no signs of infection or rash on extremities or on axial skeleton.  Abdomen: Soft nontender  Neuro: Cranial nerves II through XII are intact, neurovascularly intact in all extremities with 2+ DTRs and 2+ pulses.  Lymph: No lymphadenopathy of posterior or anterior cervical chain or axillae bilaterally.  Gait normal with good balance and coordination.  MSK:  Non tender with full range of motion  and good stability and symmetric strength and tone of  elbows, wrist, hip, knee and ankles bilaterally.  Shoulder: Right Inspection reveals no abnormalities, atrophy or asymmetry. Palpation is normal with no tenderness over AC joint or bicipital groove. ROM is full in all planes passively. Rotator cuff strength normal throughout. Negative impingement signs noted. Speeds and Yergason's tests normal. No labral pathology noted with negative Obrien's, negative clunk and good stability. Normal scapular function observed. She no does have a muscle spasm with trigger points  noted No painful arc and no drop arm sign. No apprehension sign  MSK US performed of: Right shoulder This study was ordered, performed, and interpreted by Charlann Boxer D.O.  Shoulder:   Supraspinatus:  Appears normal on long and transverse views, no bursal bulge seen with shoulder abduction on impingement view. Infraspinatus:  Appears normal on long and transverse views. Subscapularis:  Appears normal on long and transverse views. Teres Minor:  Appears normal on long and transverse views. AC joint:  Capsule undistended, no geyser sign. Glenohumeral Joint:  Appears normal without effusion. Glenoid Labrum:  Intact without visualized tears. Biceps Tendon:  Appears normal on long and transverse views, no fraying of tendon, tendon located in intertubercular groove, no subluxation with shoulder internal or external rotation. No increased power doppler signal. Impression: Normal shoulder  Osteopathic findings Cervical C2 flexed rotated and side bent right C4 flexed rotated and side bent left C6 flexed rotated and side bent left T3 extended rotated and side bent right inhaled third rib T9 extended rotated and side bent left L2 flexed rotated and side bent right Sacrum right on right    Impression and Recommendations:     This case required medical decision making of moderate complexity.      Note: This dictation was prepared with Dragon dictation along with smaller phrase technology. Any transcriptional errors that result from this process are unintentional.

## 2016-10-14 ENCOUNTER — Encounter: Payer: Self-pay | Admitting: Family Medicine

## 2016-10-14 ENCOUNTER — Ambulatory Visit (INDEPENDENT_AMBULATORY_CARE_PROVIDER_SITE_OTHER): Payer: 59 | Admitting: Family Medicine

## 2016-10-14 ENCOUNTER — Ambulatory Visit: Payer: Self-pay

## 2016-10-14 ENCOUNTER — Ambulatory Visit (INDEPENDENT_AMBULATORY_CARE_PROVIDER_SITE_OTHER)
Admission: RE | Admit: 2016-10-14 | Discharge: 2016-10-14 | Disposition: A | Discharge status: Home / Self Care | Payer: 59 | Source: Ambulatory Visit | Attending: Family Medicine | Admitting: Family Medicine

## 2016-10-14 VITALS — BP 124/80 | HR 72 | Ht 68.0 in | Wt 200.0 lb

## 2016-10-14 DIAGNOSIS — M25511 Pain in right shoulder: Secondary | ICD-10-CM

## 2016-10-14 DIAGNOSIS — M999 Biomechanical lesion, unspecified: Secondary | ICD-10-CM | POA: Insufficient documentation

## 2016-10-14 DIAGNOSIS — M940 Chondrocostal junction syndrome [Tietze]: Secondary | ICD-10-CM

## 2016-10-14 DIAGNOSIS — M542 Cervicalgia: Secondary | ICD-10-CM | POA: Diagnosis not present

## 2016-10-14 MED ORDER — GABAPENTIN 100 MG PO CAPS: 200.0000 mg | ORAL_CAPSULE | Freq: Every day | ORAL | 3 refills | Status: DC

## 2016-10-14 MED FILL — GABAPENTIN 100 MG CAPSULE: 100 | 30 days supply | Qty: 60 | Fill #0

## 2016-10-14 NOTE — Patient Instructions (Addendum)
God to see you  [name] is your friend Ice 20 minutes 2 times daily. Usually after activity and before bed. Tennis ball between shoulder blades with sitting Keep monitor at eye level.  Neck xray downstairs today  Slipped rib syndrome General: No apparent distress alert and oriented x3 mood and affect normal, dressed appropriately.  Exercises 3 times a week.  See me again in 2-3 weeks

## 2016-10-14 NOTE — Assessment & Plan Note (Signed)
Decision today to treat with OMT was based on Physical Exam  After verbal consent patient was treated with HVLA, ME, FPR techniques in cervical, thoracic, rib lumbar and sacral areas  Patient tolerated the procedure well with improvement in symptoms  Patient given exercises, stretches and lifestyle modifications  See medications in patient instructions if given  Patient will follow up in 2-4 weeks

## 2016-10-14 NOTE — Assessment & Plan Note (Signed)
Discussed with patient at great length. Patient did respond very well to osteopathic manipulation. Patient does have some signs of tightness in the neck that flexed secondary to muscle but we will get x-rays to rule out any bony or modalities that can be contribute in. Topical anti-inflammatories given, gabapentin given for nighttime release, vitamin D for strength area and patient will continue these and come back and see me again in 2-3 weeks. Worsening symptoms we'll consider trigger point in formal physical therapy.

## 2016-10-31 NOTE — Progress Notes (Signed)
[name] [address] [place], [place] [address] Phone: [phone] Subjective:    CC: Right shoulder pain f/u  [id number]  [name] is a 41 y.o. female coming in with complaint of right shoulder pain. Patient was seen previously and was diagnosed with more of a slipped rib syndrome. Patient was to try conservative therapy. Did respond fairly well to osteopathic manipulation. There is concern for some mild cervical radiculopathy and given a low dose gabapentin to take at night as well as a high-dose vitamin D for muscle strength and endurance and topical anti-inflammatories for pain. Patient states Doing significantly better at this time. States that she is not having as much pain. States that she was approximately 100% better up to the last 3 weeks and in some mild dull pain but still states she is 80% better than what she was prior to last exam.   previous imaging includes neck x-rays that were unremarkable for any bony normality. These were independently visualized by me.  Past Medical History:  Diagnosis Date  . Gestational diabetes   . Heart murmur    during pregnancy  . Preeclampsia    Past Surgical History:  Procedure Laterality Date  . CESAREAN SECTION    . DILATION AND EVACUATION  04/23/2011   Procedure: DILATATION AND EVACUATION (D&E);  Surgeon: Floyce Stakes. Fogleman;  Location: [facility];  Service: Gynecology;  Laterality: N/A;  Ultrasound Guidance  . laser iridotomy Bilateral May 2015   Social History   Social History  . Marital status: Married    Spouse name: N/A  . Number of children: 1  . Years of education: N/A   Occupational History  .  [employer]   Social History Main Topics  . Smoking status: Never Smoker  . Smokeless tobacco: Never Used  . Alcohol use No  . Drug use: No  . Sexual activity: Yes   Other Topics Concern  . None   Social History Narrative  . None   Allergies  Allergen Reactions  . Belviq  [Lorcaserin Hcl]     Crazy dreams   Family History  Problem Relation Age of Onset  . Sudden death Mother     Age 79; ? MI  . Coronary artery disease Father     Age 52  . Diabetes Father   . Hyperlipidemia Maternal Aunt   . Pulmonary fibrosis Maternal Aunt     Past medical history, social, surgical and family history all reviewed in electronic medical record.  No pertanent information unless stated regarding to the chief complaint.   Review of Systems: No headache, visual changes, nausea, vomiting, diarrhea, constipation, dizziness, abdominal pain, skin rash, fevers, chills, night sweats, weight loss, swollen lymph nodes, body aches, joint swelling, muscle aches, chest pain, shortness of breath, mood changes.    Objective  Blood pressure 110/82, pulse 66, height 5' 8"  (1.727 m), weight 200 lb (90.7 kg), last menstrual period 10/05/2016.   Systems examined below as of 11/01/16 General: NAD A&O x3 mood, affect normal  HEENT: Pupils equal, extraocular movements intact no nystagmus Respiratory: not short of breath at rest or with speaking Cardiovascular: No lower extremity edema, non tender Skin: Warm dry intact with no signs of infection or rash on extremities or on axial skeleton. Abdomen: Soft nontender, no masses Neuro: Cranial nerves  intact, neurovascularly intact in all extremities with 2+ DTRs and 2+ pulses. Lymph: No lymphadenopathy appreciated today  Gait normal with good balance and coordination.  MSK: Non tender with full range of motion and good stability and symmetric strength and tone of shoulders, elbows, wrist,  knee hips and ankles bilaterally.    Neck: Inspection unremarkable. No palpable stepoffs. Negative Spurling's maneuver. Full neck range of motion Grip strength and sensation normal in bilateral hands Strength good C4 to T1 distribution No sensory change to C4 to T1 Negative Hoffman sign bilaterally Reflexes normal  Osteopathic findings Cervical C2  flexed rotated and side bent right C7 flexed rotated and side bent left T3 extended rotated and side bent right inhaled third rib T8 extended rotated and side bent right  L2 flexed rotated and side bent left  Sacrum right on right    Impression and Recommendations:     This case required medical decision making of moderate complexity.      Note: This dictation was prepared with Dragon dictation along with smaller phrase technology. Any transcriptional errors that result from this process are unintentional.

## 2016-11-01 ENCOUNTER — Encounter: Payer: Self-pay | Admitting: Family Medicine

## 2016-11-01 ENCOUNTER — Ambulatory Visit (INDEPENDENT_AMBULATORY_CARE_PROVIDER_SITE_OTHER): Payer: 59 | Admitting: Family Medicine

## 2016-11-01 VITALS — BP 110/82 | HR 66 | Ht 68.0 in | Wt 200.0 lb

## 2016-11-01 DIAGNOSIS — M940 Chondrocostal junction syndrome [Tietze]: Secondary | ICD-10-CM

## 2016-11-01 DIAGNOSIS — M999 Biomechanical lesion, unspecified: Secondary | ICD-10-CM | POA: Diagnosis not present

## 2016-11-01 NOTE — Patient Instructions (Signed)
I am impressed Posture posture posture Keep it up  See me again in 4-6 weeks.

## 2016-11-01 NOTE — Assessment & Plan Note (Signed)
Decision today to treat with OMT was based on Physical Exam  After verbal consent patient was treated with HVLA, ME, FPR techniques in cervical, thoracic, rib lumbar and sacral areas  Patient tolerated the procedure well with improvement in symptoms  Patient given exercises, stretches and lifestyle modifications  See medications in patient instructions if given  Patient will follow up in 4-6 weeks

## 2016-11-01 NOTE — Assessment & Plan Note (Signed)
Patient seems to be doing better at this time. Still had one slipped rib. Tolerated the procedure well osteopathic manipulation. We discussed icing regimen, home exercises, which activities doing which was to avoid. Patient was to increase activity as tolerated. Continue 10 stress ergonomics at work. Patient will follow-up again in 6 weeks for further evaluation and treatment.

## 2016-11-15 ENCOUNTER — Telehealth: Payer: 59 | Admitting: Nurse Practitioner

## 2016-11-15 DIAGNOSIS — H6690 Otitis media, unspecified, unspecified ear: Secondary | ICD-10-CM | POA: Diagnosis not present

## 2016-11-15 MED ORDER — CEFDINIR 300 MG PO CAPS: 300.0000 mg | ORAL_CAPSULE | Freq: Two times a day (BID) | ORAL | 0 refills | Status: DC

## 2016-11-15 MED ORDER — FLUTICASONE PROPIONATE 50 MCG/ACT NA SUSP: 2.0000 | Freq: Every day | NASAL | 0 refills | Status: DC

## 2016-11-15 MED FILL — FLUTICASONE PROP 50 MCG SPR: 50 | 30 days supply | Qty: 16 | Fill #0

## 2016-11-15 MED FILL — CEFDINIR 300 MG CAPSULE: 300 | 10 days supply | Qty: 20 | Fill #0

## 2016-11-15 NOTE — Progress Notes (Signed)
We are sorry that you are not feeling well.  Here is how we plan to help!  Based on what you have shared with me it looks like you have upper respiratory tract inflammation that has resulted in an Acute Otitis Media.  Otitis Media is a result of an infection or inflammation of the middle ear.  Acute Otitis Media is usually preceded by an upper respiratory infection that results in blocked eustachian tubes.  This results in an accumulation of fluid and mucus, which later becomes infected.  Symptoms of otitis media include ear pain, decreased hearing and fever.    Based on your presentation I believe you most likely have Acute Otitis Media.  I am going to treat this condition with Cefdinir 342m twice daily for 10 days.  You can continue to use Ibuprofen or Tylenol for fever, pain or general discomfort.   I am also prescribing Fluticasone steroid nasal spray, 2 sprays in each nostril daily for 10 days for your nasal symptoms.     HOME CARE . Only take medications as instructed by your medical team. . Complete the entire course of an antibiotic. . Drink plenty of fluids and get plenty of rest. . Avoid close contacts especially the very young and the elderly . Cover your mouth if you cough or cough into your sleeve. . Always remember to wash your hands . A steam or ultrasonic humidifier can help congestion.   GET HELP RIGHT AWAY IF: . You develop worsening fever. . Your symptoms have not improved within 48- 72 hours. . Your symptoms persist after you have completed your treatment plan MAKE SURE YOU   Understand these instructions.  Will watch your condition.  Will get help right away if you are not doing well or get worse.  Your e-visit answers were reviewed by a board certified advanced clinical practitioner to complete your personal care plan.  Depending on the condition, your plan could have included both over the counter or prescription medications. If there is a problem please reply   once you have received a response from your provider. Your safety is important to [place]  If you have drug allergies check your prescription carefully.    You can use MyChart to ask questions about today's visit, request a non-urgent call back, or ask for a work or school excuse for 24 hours related to this e-Visit. If it has been greater than 24 hours you will need to follow up with your provider, or enter a new e-Visit to address those concerns. You will get an e-mail in the next two days asking about your experience.  I hope that your e-visit has been valuable and will speed your recovery. Thank you for using e-visits.

## 2016-11-25 ENCOUNTER — Ambulatory Visit (INDEPENDENT_AMBULATORY_CARE_PROVIDER_SITE_OTHER): Payer: 59 | Admitting: Physician Assistant

## 2016-11-25 ENCOUNTER — Encounter: Payer: Self-pay | Admitting: Physician Assistant

## 2016-11-25 VITALS — BP 114/71 | HR 69 | Ht 68.0 in | Wt 196.0 lb

## 2016-11-25 DIAGNOSIS — Z13220 Encounter for screening for lipoid disorders: Secondary | ICD-10-CM | POA: Diagnosis not present

## 2016-11-25 DIAGNOSIS — Z6829 Body mass index (BMI) 29.0-29.9, adult: Secondary | ICD-10-CM | POA: Diagnosis not present

## 2016-11-25 DIAGNOSIS — Z131 Encounter for screening for diabetes mellitus: Secondary | ICD-10-CM

## 2016-11-25 DIAGNOSIS — Z0000 Encounter for general adult medical examination without abnormal findings: Secondary | ICD-10-CM

## 2016-11-25 LAB — COMPLETE METABOLIC PANEL WITH GFR
ALT: 9 U/L (ref 6–29)
AST: 11 U/L (ref 10–30)
Albumin: 4.2 g/dL (ref 3.6–5.1)
Alkaline Phosphatase: 56 U/L (ref 33–115)
BILIRUBIN TOTAL: 0.8 mg/dL (ref 0.2–1.2)
BUN: 16 mg/dL (ref 7–25)
CHLORIDE: 105 mmol/L (ref 98–110)
CO2: 23 mmol/L (ref 20–31)
CREATININE: 0.84 mg/dL (ref 0.50–1.10)
Calcium: 9.6 mg/dL (ref 8.6–10.2)
GFR, Est African American: >89 mL/min (ref >=60)
GFR, Est Non African American: 85 mL/min (ref >=60)
GLUCOSE: 97 mg/dL (ref 65–99)
Potassium: 4.3 mmol/L (ref 3.5–5.3)
SODIUM: 141 mmol/L (ref 135–146)
TOTAL PROTEIN: 6.9 g/dL (ref 6.1–8.1)

## 2016-11-25 LAB — LIPID PANEL
Cholesterol: 172 mg/dL (ref <200)
HDL: 54 mg/dL (ref >50)
LDL CALC: 106 mg/dL — AB (ref <100)
TRIGLYCERIDES: 60 mg/dL (ref <150)
Total CHOL/HDL Ratio: 3.2 Ratio (ref <5.0)
VLDL: 12 mg/dL (ref <30)

## 2016-11-25 LAB — CBC
HEMATOCRIT: 42.2 % (ref 35.0–45.0)
HEMOGLOBIN: 14.5 g/dL (ref 11.7–15.5)
MCH: 29.9 pg (ref 27.0–33.0)
MCHC: 34.4 g/dL (ref 32.0–36.0)
MCV: 87 fL (ref 80.0–100.0)
MPV: 10.1 fL (ref 7.5–12.5)
Platelets: 233 10*3/uL (ref 140–400)
RBC: 4.85 MIL/uL (ref 3.80–5.10)
RDW: 13.2 % (ref 11.0–15.0)
WBC: 7.5 10*3/uL (ref 3.8–10.8)

## 2016-11-25 MED ORDER — PHENTERMINE HCL 37.5 MG PO TABS: 37.5000 mg | ORAL_TABLET | Freq: Every day | ORAL | 0 refills | Status: DC

## 2016-11-25 NOTE — Patient Instructions (Signed)
Keeping You Healthy  Get These Tests 1. Blood Pressure- Have your blood pressure checked once a year by your health care provider.  Normal blood pressure is 120/80. 2. Weight- Have your body mass index (BMI) calculated to screen for obesity.  BMI is measure of body fat based on height and weight.  You can also calculate your own BMI at GravelBags.it. 3. Cholesterol- Have your cholesterol checked every 5 years starting at age 43 then yearly starting at age 6. 34. Chlamydia, HIV, and other sexually transmitted diseases- Get screened every year until age 70, then within three months of each new sexual provider. 5. Pap Test - Every 1-5 years; discuss with your health care provider. 6. Mammogram- Every 1-2 years starting at age 33--50  Take these medicines  Calcium with Vitamin D-Your body needs 1200 mg of Calcium each day and [phone] IU of Vitamin D daily.  Your body can only absorb 500 mg of Calcium at a time so Calcium must be taken in 2 or 3 divided doses throughout the day.  Multivitamin with folic acid- Once daily if it is possible for you to become pregnant.  Get these Immunizations  Gardasil-Series of three doses; prevents HPV related illness such as genital warts and cervical cancer.  Menactra-Single dose; prevents meningitis.  Tetanus shot- Every 10 years.  Flu shot-Every year.  Take these steps 1. Do not smoke-Your healthcare provider can help you quit.  For tips on how to quit go to www.smokefree.gov or call 1-800 QUITNOW. 2. Be physically active- Exercise 5 days a week for at least 30 minutes.  If you are not already physically active, start slow and gradually work up to 30 minutes of moderate physical activity.  Examples of moderate activity include walking briskly, dancing, swimming, bicycling, etc. 3. Breast Cancer- A self breast exam every month is important for early detection of breast cancer.  For more information and instruction on self breast exams, ask your  healthcare provider or https://www.patel.info/. 4. Eat a healthy diet- Eat a variety of healthy foods such as fruits, vegetables, whole grains, low fat milk, low fat cheeses, yogurt, lean meats, poultry and fish, beans, nuts, tofu, etc.  For more information go to www. Thenutritionsource.org 5. Drink alcohol in moderation- Limit alcohol intake to one drink or less per day. Never drink and drive. 6. Depression- Your emotional health is as important as your physical health.  If you're feeling down or losing interest in things you normally enjoy please talk to your healthcare provider about being screened for depression. 7. Dental visit- Brush and floss your teeth twice daily; visit your dentist twice a year. 8. Eye doctor- Get an eye exam at least every 2 years. 9. Helmet use- Always wear a helmet when riding a bicycle, motorcycle, rollerblading or skateboarding. 69. Safe sex- If you may be exposed to sexually transmitted infections, use a condom. 11. Seat belts- Seat belts can save your live; always wear one. 12. Smoke/Carbon Monoxide detectors- These detectors need to be installed on the appropriate level of your home. Replace batteries at least once a year. 13. Skin cancer- When out in the sun please cover up and use sunscreen 15 SPF or higher. 14. Violence- If anyone is threatening or hurting you, please tell your healthcare provider.

## 2016-11-25 NOTE — Progress Notes (Addendum)
Subjective:     [name] is a 41 y.o. female and is here for a comprehensive physical exam. The patient reports problems - she would like to discuss medications for weight. she is exercising 3-4 times a week. she has lost 16lbs since last year. .  Social History   Social History  . Marital status: Married    Spouse name: N/A  . Number of children: 1  . Years of education: N/A   Occupational History  .  [employer]   Social History Main Topics  . Smoking status: Never Smoker  . Smokeless tobacco: Never Used  . Alcohol use No  . Drug use: No  . Sexual activity: Yes   Other Topics Concern  . Not on file   Social History Narrative  . No narrative on file   Health Maintenance  Topic Date Due  . PAP SMEAR  08/08/2016  . MAMMOGRAM  12/10/2016  . INFLUENZA VACCINE  03/08/2017  . TETANUS/TDAP  08/08/2020  . HIV Screening  Completed    The following portions of the patient's history were reviewed and updated as appropriate: allergies, current medications, past family history, past medical history, past social history, past surgical history and problem list.  Review of Systems Pertinent items noted in HPI and remainder of comprehensive ROS otherwise negative.   Objective:    BP 114/71   Pulse 69   Ht 5' 8"  (1.727 m)   Wt 196 lb (88.9 kg)   BMI 29.80 kg/m  General appearance: alert and cooperative Head: Normocephalic, without obvious abnormality, atraumatic Eyes: conjunctivae/corneas clear. PERRL, EOM's intact. Fundi benign. Ears: normal TM's and external ear canals both ears Nose: Nares normal. Septum midline. Mucosa normal. No drainage or sinus tenderness. Throat: lips, mucosa, and tongue normal; teeth and gums normal Neck: no adenopathy, no carotid bruit, no JVD, supple, symmetrical, trachea midline and thyroid not enlarged, symmetric, no tenderness/mass/nodules Back: symmetric, no curvature. ROM normal. No CVA tenderness. Lungs: clear to auscultation  bilaterally Heart: regular rate and rhythm, S1, S2 normal, no murmur, click, rub or gallop Abdomen: soft, non-tender; bowel sounds normal; no masses,  no organomegaly Extremities: extremities normal, atraumatic, no cyanosis or edema Pulses: 2+ and symmetric Skin: Skin color, texture, turgor normal. No rashes or lesions Lymph nodes: Cervical, supraclavicular, and axillary nodes normal. Neurologic: Alert and oriented X 3, normal strength and tone. Normal symmetric reflexes. Normal coordination and gait    Assessment:    Healthy female exam.      Plan:    Marland KitchenMarland KitchenDaphne was seen today for annual exam.  Diagnoses and all orders for this visit:  Routine physical examination -     Lipid panel -     COMPLETE METABOLIC PANEL WITH GFR -     Hemoglobin A1c -     CBC -     Ferritin -     TSH -     Vitamin B12 -     Vit D  25 hydroxy (rtn osteoporosis monitoring)  Screening for lipid disorders -     Lipid panel  Screening for diabetes mellitus -     COMPLETE METABOLIC PANEL WITH GFR  BMI 29.0-29.9,adult  Other orders -     phentermine (ADIPEX-P) 37.5 MG tablet; Take 1 tablet (37.5 mg total) by mouth daily before breakfast.  .. Depression screen [facility] 2/9 11/25/2016  Decreased Interest 0  Down, Depressed, Hopeless 0  PHQ - 2 Score 0    Started phentermine. Discussed side effects.  Follow up in 1 month.  150 minutes of exercise weekly and 1500 calorie diet.  Encouraged vitamin D 800 units and calcium 1519m daily/4 servings.   Pt sees GYN for pap and mammogram. Will get records after next appt.   See After Visit Summary for Counseling Recommendations

## 2016-11-26 LAB — VITAMIN B12: Vitamin B-12: 441 pg/mL (ref 200–1100)

## 2016-11-26 LAB — FERRITIN: FERRITIN: 37 ng/mL (ref 10–232)

## 2016-11-26 LAB — VITAMIN D 25 HYDROXY (VIT D DEFICIENCY, FRACTURES): VIT D 25 HYDROXY: 28 ng/mL — AB (ref 30–100)

## 2016-11-26 LAB — TSH: TSH: 1.37 mIU/L

## 2016-11-26 LAB — HEMOGLOBIN A1C
Hgb A1c MFr Bld: 4.7 % (ref <5.7)
Mean Plasma Glucose: 88 mg/dL

## 2016-11-28 NOTE — Progress Notes (Signed)
Call pt: b12 middle of the range. Consider b12 supplement.  Vitamin d just a tad low. Start D3 1000 units daily.  Cholesterol good. Kidney, liver, glucose look good.

## 2016-12-19 NOTE — Progress Notes (Signed)
[name] [place], [place] [address] Phone: [phone] Subjective:    CC: Right shoulder pain f/u  [id number]  [name] is a 41 y.o. female coming in with complaint of right shoulder pain. Patient was seen previously and was diagnosed with more of a slipped rib syndrome. Patient has been doing relatively well. This last week and started having increasing discomfort again. Denies any radiation down the arm or any numbness or tingling. States that it's a soreness that seems to be localized.   previous imaging includes neck x-rays that were unremarkable for any bony normality. These were independently visualized by me.  Past Medical History:  Diagnosis Date  . Gestational diabetes   . Heart murmur    during pregnancy  . Preeclampsia    Past Surgical History:  Procedure Laterality Date  . CESAREAN SECTION    . DILATION AND EVACUATION  04/23/2011   Procedure: DILATATION AND EVACUATION (D&E);  Surgeon: Floyce Stakes. Fogleman;  Location: [facility];  Service: Gynecology;  Laterality: N/A;  Ultrasound Guidance  . laser iridotomy Bilateral May 2015   Social History   Social History  . Marital status: Married    Spouse name: N/A  . Number of children: 1  . Years of education: N/A   Occupational History  .  [employer]   Social History Main Topics  . Smoking status: Never Smoker  . Smokeless tobacco: Never Used  . Alcohol use No  . Drug use: No  . Sexual activity: Yes   Other Topics Concern  . Not on file   Social History Narrative  . No narrative on file   Allergies  Allergen Reactions  . Belviq [Lorcaserin Hcl]     Crazy dreams   Family History  Problem Relation Age of Onset  . Sudden death Mother        Age 43; ? MI  . Coronary artery disease Father        Age 31  . Diabetes Father   . Hyperlipidemia Maternal Aunt   . Pulmonary fibrosis Maternal Aunt     Past medical history, social, surgical and family history  all reviewed in electronic medical record.  No pertanent information unless stated regarding to the chief complaint.   Review of Systems: No headache, visual changes, nausea, vomiting, diarrhea, constipation, dizziness, abdominal pain, skin rash, fevers, chills, night sweats, weight loss, swollen lymph nodes, body aches, joint swelling, muscle aches, chest pain, shortness of breath, mood changes.    Objective  There were no vitals taken for this visit.   Systems examined below as of 12/20/16 General: NAD A&O x3 mood, affect normal  HEENT: Pupils equal, extraocular movements intact no nystagmus Respiratory: not short of breath at rest or with speaking Cardiovascular: No lower extremity edema, non tender Skin: Warm dry intact with no signs of infection or rash on extremities or on axial skeleton. Abdomen: Soft nontender, no masses Neuro: Cranial nerves  intact, neurovascularly intact in all extremities with 2+ DTRs and 2+ pulses. Lymph: No lymphadenopathy appreciated today  Gait normal with good balance and coordination.  MSK: Non tender with full range of motion and good stability and symmetric strength and tone of shoulders, elbows, wrist,  knee hips and ankles bilaterally.    Neck: Inspection unremarkable. No palpable stepoffs. Negative Spurling's maneuver. Full neck range of motion Grip strength and sensation normal in bilateral hands Strength good C4 to T1 distribution No sensory change to C4  to T1 Negative Hoffman sign bilaterally Reflexes normal  Osteopathic findings C2 flexed rotated and side bent right C6 flexed rotated and side bent left T3 extended rotated and side bent right inhaled third rib T9 extended rotated and side bent left L1 flexed rotated and side bent right Sacrum right on right      Impression and Recommendations:     This case required medical decision making of moderate complexity.      Note: This dictation was prepared with Dragon dictation  along with smaller phrase technology. Any transcriptional errors that result from this process are unintentional.

## 2016-12-20 ENCOUNTER — Ambulatory Visit (INDEPENDENT_AMBULATORY_CARE_PROVIDER_SITE_OTHER): Payer: 59 | Admitting: Family Medicine

## 2016-12-20 ENCOUNTER — Encounter: Payer: Self-pay | Admitting: Family Medicine

## 2016-12-20 VITALS — BP 114/80 | HR 76 | Ht 68.0 in | Wt 198.0 lb

## 2016-12-20 DIAGNOSIS — M999 Biomechanical lesion, unspecified: Secondary | ICD-10-CM

## 2016-12-20 DIAGNOSIS — M940 Chondrocostal junction syndrome [Tietze]: Secondary | ICD-10-CM | POA: Diagnosis not present

## 2016-12-20 NOTE — Assessment & Plan Note (Signed)
Stable. Patient seemed to be doing well. We encourage patient to do the exercises on a regular basis. We discussed icing regimen and home exercises. We discussed which activities to do in which ones to avoid. Patient will continue this and follow-up again in 6-8 weeks.

## 2016-12-20 NOTE — Assessment & Plan Note (Signed)
Decision today to treat with OMT was based on Physical Exam  After verbal consent patient was treated with HVLA, ME, FPR techniques in cervical, thoracic, lumbar and sacral areas  Patient tolerated the procedure well with improvement in symptoms  Patient given exercises, stretches and lifestyle modifications  See medications in patient instructions if given  Patient will follow up in 6-8 weeks

## 2016-12-20 NOTE — Patient Instructions (Addendum)
Good to see you  [name] is your friend.  Shoulders back  Lift with the legs See me again in 6-8 weeks.

## 2016-12-23 DIAGNOSIS — Z3200 Encounter for pregnancy test, result unknown: Secondary | ICD-10-CM | POA: Diagnosis not present

## 2016-12-23 DIAGNOSIS — N920 Excessive and frequent menstruation with regular cycle: Secondary | ICD-10-CM | POA: Diagnosis not present

## 2016-12-23 DIAGNOSIS — Z1151 Encounter for screening for human papillomavirus (HPV): Secondary | ICD-10-CM | POA: Diagnosis not present

## 2016-12-23 DIAGNOSIS — Z01419 Encounter for gynecological examination (general) (routine) without abnormal findings: Secondary | ICD-10-CM | POA: Diagnosis not present

## 2016-12-23 DIAGNOSIS — Z6831 Body mass index (BMI) 31.0-31.9, adult: Secondary | ICD-10-CM | POA: Diagnosis not present

## 2016-12-28 LAB — HM PAP SMEAR: HM Pap smear: NEGATIVE

## 2017-01-11 DIAGNOSIS — D259 Leiomyoma of uterus, unspecified: Secondary | ICD-10-CM | POA: Diagnosis not present

## 2017-01-11 DIAGNOSIS — N920 Excessive and frequent menstruation with regular cycle: Secondary | ICD-10-CM | POA: Diagnosis not present

## 2017-01-11 DIAGNOSIS — Z1231 Encounter for screening mammogram for malignant neoplasm of breast: Secondary | ICD-10-CM | POA: Diagnosis not present

## 2017-01-19 DIAGNOSIS — N6002 Solitary cyst of left breast: Secondary | ICD-10-CM | POA: Diagnosis not present

## 2017-02-14 ENCOUNTER — Emergency Department
Admission: EM | Admit: 2017-02-14 | Discharge: 2017-02-14 | Disposition: A | Discharge status: Home / Self Care | Payer: 59 | Source: Home / Self Care | Attending: Family Medicine | Admitting: Family Medicine

## 2017-02-14 ENCOUNTER — Encounter: Payer: Self-pay | Admitting: *Deleted

## 2017-02-14 ENCOUNTER — Ambulatory Visit: Payer: 59 | Admitting: Family Medicine

## 2017-02-14 DIAGNOSIS — N3001 Acute cystitis with hematuria: Secondary | ICD-10-CM

## 2017-02-14 DIAGNOSIS — R350 Frequency of micturition: Secondary | ICD-10-CM

## 2017-02-14 LAB — POCT URINALYSIS DIP (MANUAL ENTRY)
Bilirubin, UA: NEGATIVE
Glucose, UA: NEGATIVE mg/dL
Nitrite, UA: POSITIVE — AB
Protein Ur, POC: 100 mg/dL — AB
Spec Grav, UA: 1.025 (ref 1.010–1.025)
Urobilinogen, UA: 1 E.U./dL
pH, UA: 6 (ref 5.0–8.0)

## 2017-02-14 MED ORDER — PHENAZOPYRIDINE HCL 200 MG PO TABS: 200.0000 mg | ORAL_TABLET | Freq: Three times a day (TID) | ORAL | 0 refills | Status: DC

## 2017-02-14 MED ORDER — CEPHALEXIN 500 MG PO CAPS: 500.0000 mg | ORAL_CAPSULE | Freq: Two times a day (BID) | ORAL | 0 refills | Status: DC

## 2017-02-14 MED FILL — CEPHALEXIN 500 MG CAPSULE: 500 | 7 days supply | Qty: 14 | Fill #0

## 2017-02-14 MED FILL — PHENAZOPYRIDINE 200 MG TAB: 200 | 2 days supply | Qty: 6 | Fill #0

## 2017-02-14 NOTE — ED Triage Notes (Signed)
Patient c/o 2 days of urinary frequency, inability to empty bladder and bladder cramping. Possible fever last night.

## 2017-02-14 NOTE — ED Provider Notes (Signed)
CSN: [id number]     Arrival date & time 02/14/17  1658 History   First MD Initiated Contact with Patient 02/14/17 1725     Chief Complaint  Patient presents with  . Urinary Frequency  . Bladder Pain   (Consider location/radiation/quality/duration/timing/severity/associated sxs/prior Treatment) HPI  [name] is a 41 y.o. female presenting to UC with c/o 2 days of urinary frequency and inability to empty bladder.  Bladder cramping, scant hematuria once and possible fever last night. Denies n/v/d. Denies back pain. Hx of UTIs, last one about 6 months ago.  Denies vaginal symptoms.   Past Medical History:  Diagnosis Date  . Gestational diabetes   . Heart murmur    during pregnancy  . Preeclampsia    Past Surgical History:  Procedure Laterality Date  . CESAREAN SECTION    . DILATION AND EVACUATION  04/23/2011   Procedure: DILATATION AND EVACUATION (D&E);  Surgeon: Floyce Stakes. Fogleman;  Location: [facility];  Service: Gynecology;  Laterality: N/A;  Ultrasound Guidance  . laser iridotomy Bilateral May 2015   Family History  Problem Relation Age of Onset  . Sudden death Mother        Age 51; ? MI  . Coronary artery disease Father        Age 32  . Diabetes Father   . Hyperlipidemia Maternal Aunt   . Pulmonary fibrosis Maternal Aunt    Social History  Substance Use Topics  . Smoking status: Never Smoker  . Smokeless tobacco: Never Used  . Alcohol use No   OB History    No data available     Review of Systems  Constitutional: Positive for fever ( subjective). Negative for chills.  Gastrointestinal: Positive for abdominal pain (bladder pressure). Negative for diarrhea, nausea and vomiting.  Genitourinary: Positive for decreased urine volume, dysuria, frequency, hematuria and urgency. Negative for flank pain, pelvic pain, vaginal bleeding, vaginal discharge and vaginal pain.  Musculoskeletal: Negative for back pain and myalgias.    Allergies  Belviq [lorcaserin hcl]  Home  Medications   Prior to Admission medications   Medication Sig Start Date End Date Taking? Authorizing Provider  cephALEXin (KEFLEX) 500 MG capsule Take 1 capsule (500 mg total) by mouth 2 (two) times daily. 02/14/17   Noe Gens, PA-C  phenazopyridine (PYRIDIUM) 200 MG tablet Take 1 tablet (200 mg total) by mouth 3 (three) times daily. 02/14/17   Noe Gens, PA-C   Meds Ordered and Administered this Visit  Medications - No data to display  Pulse 75   Temp 98.6 F (37 C) (Oral)   Wt 199 lb (90.3 kg)   LMP 02/05/2017   SpO2 97%   BMI 30.26 kg/m  No data found.   Physical Exam  Constitutional: She is oriented to person, place, and time. She appears well-developed and well-nourished. No distress.  HENT:  Head: Normocephalic and atraumatic.  Mouth/Throat: Oropharynx is clear and moist.  Eyes: EOM are normal.  Neck: Normal range of motion.  Cardiovascular: Normal rate and regular rhythm.   Pulmonary/Chest: Effort normal and breath sounds normal. No respiratory distress. She has no wheezes. She has no rales.  Abdominal: Soft. She exhibits no distension and no mass. There is tenderness ( suprapubic). There is no rebound and no guarding.  Musculoskeletal: Normal range of motion.  Neurological: She is alert and oriented to person, place, and time.  Skin: Skin is warm and dry. She is not diaphoretic.  Psychiatric: She has a normal mood  and affect. Her behavior is normal.  Nursing note and vitals reviewed.   Urgent Care Course     Procedures (including critical care time)  Labs Review Labs Reviewed  POCT URINALYSIS DIP (MANUAL ENTRY) - Abnormal; Notable for the following:       Result Value   Clarity, UA cloudy (*)    Ketones, POC UA trace (5) (*)    Blood, UA moderate (*)    Protein Ur, POC =100 (*)    Nitrite, UA Positive (*)    Leukocytes, UA Small (1+) (*)    All other components within normal limits  URINE CULTURE    Imaging Review No results found.    MDM    1. Urinary frequency   2. Acute cystitis with hematuria    Hx and UA c/w UTI Culture sent  Rx: Keflex and pyridium F/u with PCP in 1 week if not improving.     Noe Gens, [place] 02/14/17 [phone]

## 2017-02-17 ENCOUNTER — Telehealth: Payer: Self-pay

## 2017-02-17 LAB — URINE CULTURE

## 2017-02-17 NOTE — Telephone Encounter (Signed)
Feeling much better.  Emphasized to continue medication through the full course.

## 2017-02-22 ENCOUNTER — Ambulatory Visit: Payer: 59 | Admitting: Family Medicine

## 2017-03-21 ENCOUNTER — Encounter: Payer: Self-pay | Admitting: Family Medicine

## 2017-03-21 ENCOUNTER — Ambulatory Visit (INDEPENDENT_AMBULATORY_CARE_PROVIDER_SITE_OTHER): Payer: 59 | Admitting: Family Medicine

## 2017-03-21 VITALS — BP 128/82 | HR 84 | Ht 68.0 in | Wt 197.0 lb

## 2017-03-21 DIAGNOSIS — M999 Biomechanical lesion, unspecified: Secondary | ICD-10-CM | POA: Diagnosis not present

## 2017-03-21 DIAGNOSIS — M76891 Other specified enthesopathies of right lower limb, excluding foot: Secondary | ICD-10-CM

## 2017-03-21 DIAGNOSIS — M940 Chondrocostal junction syndrome [Tietze]: Secondary | ICD-10-CM

## 2017-03-21 NOTE — Assessment & Plan Note (Signed)
Decision today to treat with OMT was based on Physical Exam  After verbal consent patient was treated with HVLA, ME, FPR techniques in cervical, thoracic, rib lumbar and sacral areas  Patient tolerated the procedure well with improvement in symptoms  Patient given exercises, stretches and lifestyle modifications  See medications in patient instructions if given  Patient will follow up in 8 weeks

## 2017-03-21 NOTE — Assessment & Plan Note (Signed)
Continues to have some tightness. Discussed the stretching. I think that this will be beneficial. Follow-up with me again in 2 weeks

## 2017-03-21 NOTE — Assessment & Plan Note (Signed)
Discussed lifting mechanics with patient. We discussed icing regimen. We discussed posture. Patient has done relatively well. Some mild increasing tightness from previous exam. Follow-up with me again in 3-8 weeks.

## 2017-03-21 NOTE — Progress Notes (Signed)
[name] [place], [place] [address] Phone: [phone] Subjective:    CC: Right shoulder pain f/u  [id number]  [name] is a 41 y.o. female coming in with complaint of right shoulder pain. Patient was seen previously and was diagnosed with more of a slipped rib syndrome. Patient has been moving recently. Patient has been lifting a lot more boxes. Noticing more tightness of the lower back. No radiation down the legs, no numbness. Some mild pain in the thoracolumbar juncture as well as.   previous imaging includes neck x-rays that were unremarkable for any bony normality. These were independently visualized by me.  Past Medical History:  Diagnosis Date  . Gestational diabetes   . Heart murmur    during pregnancy  . Preeclampsia    Past Surgical History:  Procedure Laterality Date  . CESAREAN SECTION    . DILATION AND EVACUATION  04/23/2011   Procedure: DILATATION AND EVACUATION (D&E);  Surgeon: Floyce Stakes. Fogleman;  Location: [facility];  Service: Gynecology;  Laterality: N/A;  Ultrasound Guidance  . laser iridotomy Bilateral May 2015   Social History   Social History  . Marital status: Married    Spouse name: N/A  . Number of children: 1  . Years of education: N/A   Occupational History  .  [employer]   Social History Main Topics  . Smoking status: Never Smoker  . Smokeless tobacco: Never Used  . Alcohol use No  . Drug use: No  . Sexual activity: Yes   Other Topics Concern  . None   Social History Narrative  . None   Allergies  Allergen Reactions  . Belviq [Lorcaserin Hcl]     Crazy dreams   Family History  Problem Relation Age of Onset  . Sudden death Mother        Age 66; ? MI  . Coronary artery disease Father        Age 23  . Diabetes Father   . Hyperlipidemia Maternal Aunt   . Pulmonary fibrosis Maternal Aunt     Past medical history, social, surgical and family history all reviewed in electronic  medical record.  No pertanent information unless stated regarding to the chief complaint.   Review of Systems: No headache, visual changes, nausea, vomiting, diarrhea, constipation, dizziness, abdominal pain, skin rash, fevers, chills, night sweats, weight loss, swollen lymph nodes, body aches, joint swelling, muscle aches, chest pain, shortness of breath, mood changes. \   Objective  Blood pressure 128/82, pulse 84, height 5' 8"  (1.727 m), weight 197 lb (89.4 kg), SpO2 97 %.   Systems examined below as of 03/21/17 General: NAD A&O x3 mood, affect normal  HEENT: Pupils equal, extraocular movements intact no nystagmus Respiratory: not short of breath at rest or with speaking Cardiovascular: No lower extremity edema, non tender Skin: Warm dry intact with no signs of infection or rash on extremities or on axial skeleton. Abdomen: Soft nontender, no masses Neuro: Cranial nerves  intact, neurovascularly intact in all extremities with 2+ DTRs and 2+ pulses. Lymph: No lymphadenopathy appreciated today  Gait normal with good balance and coordination.  MSK: Non tender with full range of motion and good stability and symmetric strength and tone of shoulders, elbows, wrist,  knee hips and ankles bilaterally.     Neck: Inspection unremarkable. No palpable stepoffs. Negative Spurling's maneuver. Mild tightness lacking the last 5 of side and bilaterally Grip strength and sensation normal in bilateral  hands Strength good C4 to T1 distribution No sensory change to C4 to T1 Negative Hoffman sign bilaterally Reflexes normal  Low back exam shows the patient is some tightness but hip flexors bilaterally. Limiting the last 5-10 of extension.  Osteopathic findings Cervical C2 flexed rotated and side bent right C4 flexed rotated and side bent left C7 flexed rotated and side bent left T3 extended rotated and side bent right inhaled third rib T9 extended rotated and side bent left L2 flexed rotated  and side bent right L5 flexed rotated and side bent left Sacrum right on right      Impression and Recommendations:     This case required medical decision making of moderate complexity.      Note: This dictation was prepared with Dragon dictation along with smaller phrase technology. Any transcriptional errors that result from this process are unintentional.

## 2017-03-21 NOTE — Patient Instructions (Signed)
Good to see you  Ice is your friend See me again in 8-10 weeks! Good luck with the house

## 2017-04-28 MED FILL — NITROFURANTOIN MONO-MCR 100: 100 | 5 days supply | Qty: 10 | Fill #0

## 2017-06-03 ENCOUNTER — Emergency Department
Admission: EM | Admit: 2017-06-03 | Discharge: 2017-06-03 | Disposition: A | Discharge status: Home / Self Care | Payer: 59 | Source: Home / Self Care | Attending: Family Medicine | Admitting: Family Medicine

## 2017-06-03 ENCOUNTER — Encounter: Payer: Self-pay | Admitting: Emergency Medicine

## 2017-06-03 DIAGNOSIS — N3090 Cystitis, unspecified without hematuria: Secondary | ICD-10-CM | POA: Diagnosis not present

## 2017-06-03 LAB — POCT URINALYSIS DIP (MANUAL ENTRY)
BILIRUBIN UA: NEGATIVE
GLUCOSE UA: NEGATIVE mg/dL
NITRITE UA: NEGATIVE
Protein Ur, POC: NEGATIVE mg/dL
Spec Grav, UA: <=1.005 — AB (ref 1.010–1.025)
Urobilinogen, UA: 0.2 E.U./dL
pH, UA: 5.5 (ref 5.0–8.0)

## 2017-06-03 MED ORDER — PHENAZOPYRIDINE HCL 200 MG PO TABS: 200.0000 mg | ORAL_TABLET | Freq: Three times a day (TID) | ORAL | 0 refills | Status: DC

## 2017-06-03 MED ORDER — NITROFURANTOIN MONOHYD MACRO 100 MG PO CAPS: 100.0000 mg | ORAL_CAPSULE | Freq: Two times a day (BID) | ORAL | 0 refills | Status: DC

## 2017-06-03 NOTE — Discharge Instructions (Signed)
Increase fluid intake. If symptoms become significantly worse during the night or over the weekend, proceed to the local emergency room.

## 2017-06-03 NOTE — ED Triage Notes (Signed)
Dysuria x 2 days, frequency

## 2017-06-03 NOTE — ED Provider Notes (Signed)
[name]    CSN: [id number] Arrival date & time: 06/03/17  1428     History   Chief Complaint Chief Complaint  Patient presents with  . Dysuria    HPI [name] is a 41 y.o. female.   Patient awoke with sensation of increased pressure in her bladder this morning, followed by frequency and urgency.  No pelvic or abdominal pain.  No fevers, chills, and sweats.  She states that she had a UTI last month that resolved.   The history is provided by the patient.  Dysuria  Pain quality:  Burning Pain severity:  Mild Onset quality:  Sudden Duration:  8 hours Timing:  Constant Progression:  Worsening Chronicity:  Recurrent Recent urinary tract infections: yes   Relieved by:  None tried Worsened by:  Nothing Ineffective treatments:  None tried Urinary symptoms: frequent urination and hesitancy   Urinary symptoms: no discolored urine, no foul-smelling urine, no hematuria and no bladder incontinence   Associated symptoms: no abdominal pain, no fever, no flank pain, no genital lesions, no nausea, no vaginal discharge and no vomiting     Past Medical History:  Diagnosis Date  . Gestational diabetes   . Heart murmur    during pregnancy  . Preeclampsia     Patient Active Problem List   Diagnosis Date Noted  . BMI 29.0-29.9,adult 11/25/2016  . 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 Nonallopathic lesion of rib cage 10/14/2016  . Nonallopathic lesion of cervical region 10/14/2016  . Nonallopathic lesion of thoracic region 10/14/2016  . Hip flexor tendinitis, right 06/17/2016  . Abnormal weight gain 09/18/2015  . GDM (gestational diabetes mellitus) 09/18/2015  . 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 Palpitations 11/24/2010  . Murmur 11/24/2010  . Dyspnea 11/24/2010    Past Surgical History:  Procedure Laterality Date  . CESAREAN SECTION    . DILATION AND EVACUATION  04/23/2011   Procedure: DILATATION AND EVACUATION (D&E);  Surgeon: Floyce Stakes.  Fogleman;  Location: [facility];  Service: Gynecology;  Laterality: N/A;  Ultrasound Guidance  . laser iridotomy Bilateral May 2015    OB History    No data available       Home Medications    Prior to Admission medications   Medication Sig Start Date End Date Taking? Authorizing Provider  nitrofurantoin, macrocrystal-monohydrate, (MACROBID) 100 MG capsule Take 1 capsule (100 mg total) by mouth 2 (two) times daily. Take with food. 06/03/17   Kandra Nicolas, MD  phenazopyridine (PYRIDIUM) 200 MG tablet Take 1 tablet (200 mg total) by mouth 3 (three) times daily. 06/03/17   Kandra Nicolas, MD    Family History Family History  Problem Relation Age of Onset  . Sudden death Mother        Age 26; ? MI  . Coronary artery disease Father        Age 82  . Diabetes Father   . Hyperlipidemia Maternal Aunt   . Pulmonary fibrosis Maternal Aunt     Social History Social History  Substance Use Topics  . Smoking status: Never Smoker  . Smokeless tobacco: Never Used  . Alcohol use No     Allergies   Belviq [lorcaserin hcl]   Review of Systems Review of Systems  Constitutional: Negative for fever.  Gastrointestinal: Negative for abdominal pain, nausea and vomiting.  Genitourinary: Positive for dysuria. Negative for flank pain and vaginal discharge.  All other systems reviewed and are negative.    Physical Exam Triage  Vital Signs ED Triage Vitals  Enc Vitals Group     BP 06/03/17 1509 124/78     Pulse Rate 06/03/17 1509 80     Resp 06/03/17 1509 16     Temp 06/03/17 1509 98.2 F (36.8 C)     Temp Source 06/03/17 1509 Oral     SpO2 06/03/17 1509 98 %     Weight 06/03/17 1509 201 lb (91.2 kg)     Height 06/03/17 1509 5' 8"  (1.727 m)     Head Circumference --      Peak Flow --      Pain Score 06/03/17 1510 5     Pain Loc --      Pain Edu? --      Excl. in [place]? --    No data found.   Updated Vital Signs BP 124/78 (BP Location: Left Arm)   Pulse 80   Temp 98.2 F  (36.8 C) (Oral)   Resp 16   Ht 5' 8"  (1.727 m)   Wt 201 lb (91.2 kg)   SpO2 98%   BMI 30.56 kg/m   Visual Acuity Right Eye Distance:   Left Eye Distance:   Bilateral Distance:    Right Eye Near:   Left Eye Near:    Bilateral Near:     Physical Exam Nursing notes and Vital Signs reviewed. Appearance:  Patient appears stated age, and in no acute distress.    Eyes:  Pupils are equal, round, and reactive to light and accomodation.  Extraocular movement is intact.  Conjunctivae are not inflamed   Pharynx:  Normal; moist mucous membranes  Neck:  Supple.  No adenopathy Lungs:  Clear to auscultation.  Breath sounds are equal.  Moving air well. Heart:  Regular rate and rhythm without murmurs, rubs, or gallops.  Abdomen:  Nontender without masses or hepatosplenomegaly.  Bowel sounds are present.  No CVA or flank tenderness.  Extremities:  No edema.  Skin:  No rash present.     UC Treatments / Results  Labs (all labs ordered are listed, but only abnormal results are displayed) Labs Reviewed  POCT URINALYSIS DIP (MANUAL ENTRY) - Abnormal; Notable for the following:       Result Value   Ketones, POC UA trace (5) (*)    Spec Grav, UA <=1.005 (*)    Blood, UA large (*)    Leukocytes, UA Trace (*)    All other components within normal limits    EKG  EKG Interpretation None       Radiology No results found.  Procedures Procedures (including critical care time)  Medications Ordered in UC Medications - No data to display   Initial Impression / Assessment and Plan / UC Course  I have reviewed the triage vital signs and the nursing notes.  Pertinent labs & imaging results that were available during my care of the patient were reviewed by me and considered in my medical decision making (see chart for details).    Urine culture pending. Begin Macrobid 177m BID for one week.  Rx given for Pyridium. Increase fluid intake. If symptoms become significantly worse during the  night or over the weekend, proceed to the local emergency room.  Followup with Family Doctor if not improved in one week.     Final Clinical Impressions(s) / UC Diagnoses   Final diagnoses:  Cystitis    New Prescriptions New Prescriptions   NITROFURANTOIN, MACROCRYSTAL-MONOHYDRATE, (MACROBID) 100 MG CAPSULE    Take 1 capsule (  100 mg total) by mouth 2 (two) times daily. Take with food.         Kandra Nicolas, MD 06/04/17 [place] Hare

## 2017-06-05 LAB — URINE CULTURE
MICRO NUMBER: 81207313
SPECIMEN QUALITY: ADEQUATE

## 2017-06-06 ENCOUNTER — Telehealth: Payer: Self-pay | Admitting: *Deleted

## 2017-06-06 NOTE — Telephone Encounter (Signed)
Callback: Patient notified of [place] results. She reports she is starting to improve. Encouraged to complete antibiotic course.

## 2017-06-25 ENCOUNTER — Encounter: Payer: Self-pay | Admitting: Emergency Medicine

## 2017-06-25 ENCOUNTER — Emergency Department
Admission: EM | Admit: 2017-06-25 | Discharge: 2017-06-25 | Disposition: A | Discharge status: Home / Self Care | Payer: 59 | Source: Home / Self Care | Attending: Family Medicine | Admitting: Family Medicine

## 2017-06-25 DIAGNOSIS — J209 Acute bronchitis, unspecified: Secondary | ICD-10-CM

## 2017-06-25 MED ORDER — AZITHROMYCIN 250 MG PO TABS: 250.0000 mg | ORAL_TABLET | Freq: Every day | ORAL | 0 refills | Status: DC

## 2017-06-25 MED ORDER — IPRATROPIUM BROMIDE 0.06 % NA SOLN: 2.0000 | Freq: Four times a day (QID) | NASAL | 1 refills | Status: DC

## 2017-06-25 MED ORDER — PROMETHAZINE-DM 6.25-15 MG/5ML PO SYRP: 5.0000 mL | ORAL_SOLUTION | Freq: Two times a day (BID) | ORAL | 0 refills | Status: DC | PRN

## 2017-06-25 MED ORDER — ALBUTEROL SULFATE HFA 108 (90 BASE) MCG/ACT IN AERS: 1.0000 | INHALATION_SPRAY | Freq: Four times a day (QID) | RESPIRATORY_TRACT | 0 refills | Status: DC | PRN

## 2017-06-25 NOTE — Discharge Instructions (Signed)
°  You may take 537m acetaminophen every 4-6 hours or in combination with ibuprofen 400-6026mevery 6-8 hours as needed for pain, inflammation, and fever.  Be sure to drink at least eight 8oz glasses of water to stay well hydrated and get at least 8 hours of sleep at night, preferably more while sick.

## 2017-06-25 NOTE — ED Provider Notes (Signed)
[name]    CSN: [id number] Arrival date & time: 06/25/17  1451     History   Chief Complaint Chief Complaint  Patient presents with  . Cough    HPI [name] is a 41 y.o. female.   HPI  [name] is a 41 y.o. female presenting to UC with c/o 3 days gradually worsening flu-like symptoms including chest tightness, feeling lethargic, chest congestion, fever, and body aches.  She did receive the flu vaccine about 1 month ago.  Her son was dx clinically last week with pneumonia.  Pt has been taking ibuprofen, tylenol and nyquil with mild relief. Last dose of medication was around 9AM this morning.  Denies n/v/d. No hx of asthma.    Past Medical History:  Diagnosis Date  . Gestational diabetes   . Heart murmur    during pregnancy  . Preeclampsia     Patient Active Problem List   Diagnosis Date Noted  . BMI 29.0-29.9,adult 11/25/2016  . 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 Nonallopathic lesion of rib cage 10/14/2016  . Nonallopathic lesion of cervical region 10/14/2016  . Nonallopathic lesion of thoracic region 10/14/2016  . Hip flexor tendinitis, right 06/17/2016  . Abnormal weight gain 09/18/2015  . GDM (gestational diabetes mellitus) 09/18/2015  . 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 Palpitations 11/24/2010  . Murmur 11/24/2010  . Dyspnea 11/24/2010    Past Surgical History:  Procedure Laterality Date  . CESAREAN SECTION    . DILATATION AND EVACUATION N/A 04/23/2011   Performed by Ala Dach., MD at [facility]  . laser iridotomy Bilateral May 2015    OB History    No data available       Home Medications    Prior to Admission medications   Medication Sig Start Date End Date Taking? Authorizing Provider  CRANBERRY PO Take by mouth.   Yes [provider]  Probiotic Product (PROBIOTIC PO) Take by mouth.   Yes [provider]  albuterol (PROVENTIL HFA;VENTOLIN HFA) 108 (90 Base) MCG/ACT inhaler Inhale 1-2  puffs every 6 (six) hours as needed into the lungs for wheezing or shortness of breath. 06/25/17   Noe Gens, PA-C  azithromycin (ZITHROMAX) 250 MG tablet Take 1 tablet (250 mg total) daily by mouth. Take first 2 tablets together, then 1 every day until finished. 06/25/17   Noe Gens, PA-C  ipratropium (ATROVENT) 0.06 % nasal spray Place 2 sprays 4 (four) times daily into both nostrils. 06/25/17   Noe Gens, PA-C  promethazine-dextromethorphan (PROMETHAZINE-DM) 6.25-15 MG/5ML syrup Take 5 mLs 2 (two) times daily as needed by mouth for cough. 06/25/17   Noe Gens, PA-C    Family History Family History  Problem Relation Age of Onset  . Sudden death Mother        Age 74; ? MI  . Coronary artery disease Father        Age 63  . Diabetes Father   . Hyperlipidemia Maternal Aunt   . Pulmonary fibrosis Maternal Aunt     Social History Social History   Tobacco Use  . Smoking status: Never Smoker  . Smokeless tobacco: Never Used  Substance Use Topics  . Alcohol use: No  . Drug use: No     Allergies   Belviq [lorcaserin hcl]   Review of Systems Review of Systems  Constitutional: Positive for fatigue and fever. Negative for chills.  HENT: Positive for congestion and rhinorrhea. Negative for  ear pain, sore throat, trouble swallowing and voice change.   Respiratory: Positive for cough. Negative for shortness of breath.   Cardiovascular: Negative for chest pain and palpitations.  Gastrointestinal: Negative for abdominal pain, diarrhea, nausea and vomiting.  Genitourinary: Negative for dysuria, flank pain, frequency, hematuria and urgency.  Musculoskeletal: Positive for arthralgias, back pain and myalgias.  Skin: Negative for rash.  Neurological: Positive for weakness (generalized). Negative for dizziness, light-headedness, numbness and headaches.     Physical Exam Triage Vital Signs ED Triage Vitals  Enc Vitals Group     BP 06/25/17 1514 126/85     Pulse Rate  06/25/17 1514 98     Resp --      Temp 06/25/17 1514 (!) 100.5 F (38.1 C)     Temp Source 06/25/17 1514 Oral     SpO2 06/25/17 1514 97 %     Weight 06/25/17 1515 203 lb (92.1 kg)     Height 06/25/17 1515 5' 8"  (1.727 m)     Head Circumference --      Peak Flow --      Pain Score 06/25/17 1515 0     Pain Loc --      Pain Edu? --      Excl. in [place]? --    No data found.  Updated Vital Signs BP 126/85 (BP Location: Left Arm)   Pulse 98   Temp (!) 100.5 F (38.1 C) (Oral)   Ht 5' 8"  (1.727 m)   Wt 203 lb (92.1 kg)   SpO2 97%   BMI 30.87 kg/m   Visual Acuity Right Eye Distance:   Left Eye Distance:   Bilateral Distance:    Right Eye Near:   Left Eye Near:    Bilateral Near:     Physical Exam  Constitutional: She is oriented to person, place, and time. She appears well-developed and well-nourished. No distress.  HENT:  Head: Normocephalic and atraumatic.  Right Ear: Tympanic membrane normal.  Left Ear: Tympanic membrane normal.  Nose: Mucosal edema present. Right sinus exhibits no maxillary sinus tenderness and no frontal sinus tenderness. Left sinus exhibits no maxillary sinus tenderness and no frontal sinus tenderness.  Mouth/Throat: Uvula is midline, oropharynx is clear and moist and mucous membranes are normal.  Eyes: EOM are normal.  Neck: Normal range of motion. Neck supple.  Cardiovascular: Normal rate and regular rhythm.  Pulmonary/Chest: Effort normal. She has no decreased breath sounds. She has no wheezes. She has rhonchi in the right lower field. She has no rales.  Musculoskeletal: Normal range of motion.  Lymphadenopathy:    She has no cervical adenopathy.  Neurological: She is alert and oriented to person, place, and time.  Skin: Skin is warm and dry. She is not diaphoretic.  Psychiatric: She has a normal mood and affect. Her behavior is normal.  Nursing note and vitals reviewed.    UC Treatments / Results  Labs (all labs ordered are listed, but only  abnormal results are displayed) Labs Reviewed - No data to display  EKG  EKG Interpretation None       Radiology No results found.  Procedures Procedures (including critical care time)  Medications Ordered in UC Medications - No data to display   Initial Impression / Assessment and Plan / UC Course  I have reviewed the triage vital signs and the nursing notes.  Pertinent labs & imaging results that were available during my care of the patient were reviewed by me and considered in  my medical decision making (see chart for details).     Coarse breath sounds in Right lower lung field.  Will cover for acute bronchitis and potential early pneumonia. Encouraged fluids, rest, and to continue acetaminophen and ibuprofen F/u with PCP in 1 week if not improving Work note provided for tomorrow.   Final Clinical Impressions(s) / UC Diagnoses   Final diagnoses:  Acute bronchitis, unspecified organism    ED Discharge Orders        Ordered    azithromycin (ZITHROMAX) 250 MG tablet  Daily     06/25/17 1534    promethazine-dextromethorphan (PROMETHAZINE-DM) 6.25-15 MG/5ML syrup  2 times daily PRN     06/25/17 1534    ipratropium (ATROVENT) 0.06 % nasal spray  4 times daily     06/25/17 1534    albuterol (PROVENTIL HFA;VENTOLIN HFA) 108 (90 Base) MCG/ACT inhaler  Every 6 hours PRN     06/25/17 1534       Controlled Substance Prescriptions [place] Controlled Substance Registry consulted? Not Applicable   Tyrell Antonio 06/25/17 5183

## 2017-06-25 NOTE — ED Triage Notes (Signed)
Patient complaining of flu like symptoms, just feeling lathargic, chest congestion, fever, taking OTC meds Ibuprofen, Tylenol and Nyquil.

## 2017-07-26 DIAGNOSIS — N6002 Solitary cyst of left breast: Secondary | ICD-10-CM | POA: Diagnosis not present

## 2017-08-02 ENCOUNTER — Encounter: Payer: Self-pay | Admitting: Emergency Medicine

## 2017-08-02 ENCOUNTER — Emergency Department (INDEPENDENT_AMBULATORY_CARE_PROVIDER_SITE_OTHER)
Admission: EM | Admit: 2017-08-02 | Discharge: 2017-08-02 | Disposition: A | Discharge status: Home / Self Care | Payer: 59 | Source: Home / Self Care | Attending: Family Medicine | Admitting: Family Medicine

## 2017-08-02 DIAGNOSIS — N3090 Cystitis, unspecified without hematuria: Secondary | ICD-10-CM | POA: Diagnosis not present

## 2017-08-02 LAB — POCT URINALYSIS DIP (MANUAL ENTRY)
Bilirubin, UA: NEGATIVE
Blood, UA: NEGATIVE
GLUCOSE UA: NEGATIVE mg/dL
Ketones, POC UA: NEGATIVE mg/dL
LEUKOCYTES UA: NEGATIVE
NITRITE UA: NEGATIVE
PROTEIN UA: NEGATIVE mg/dL
Spec Grav, UA: 1.02 (ref 1.010–1.025)
UROBILINOGEN UA: 1 U/dL
pH, UA: 7.5 (ref 5.0–8.0)

## 2017-08-02 MED ORDER — CEPHALEXIN 500 MG PO CAPS: 500.0000 mg | ORAL_CAPSULE | Freq: Two times a day (BID) | ORAL | 0 refills | Status: DC

## 2017-08-02 NOTE — Discharge Instructions (Signed)
Increase fluid intake. If symptoms become significantly worse during the night or over the weekend, proceed to the local emergency room.

## 2017-08-02 NOTE — ED Provider Notes (Signed)
[name]    CSN: [id number] Arrival date & time: 08/02/17  1748     History   Chief Complaint Chief Complaint  Patient presents with  . Dysuria    HPI [name] is a 41 y.o. female.   Patient complains of two day history of dysuria, frequency, nocturia, and hesitancy.   The history is provided by the patient.  Dysuria  Pain quality:  Burning Pain severity:  Mild Onset quality:  Sudden Duration:  2 days Timing:  Constant Progression:  Worsening Chronicity:  New Recent urinary tract infections: no   Relieved by:  Nothing Worsened by:  Nothing Ineffective treatments:  None tried Urinary symptoms: frequent urination and hesitancy   Urinary symptoms: no discolored urine, no foul-smelling urine, no hematuria and no bladder incontinence   Associated symptoms: no abdominal pain, no fever, no flank pain, no genital lesions, no nausea, no vaginal discharge and no vomiting   Risk factors: recurrent urinary tract infections     Past Medical History:  Diagnosis Date  . Gestational diabetes   . Heart murmur    during pregnancy  . Preeclampsia     Patient Active Problem List   Diagnosis Date Noted  . BMI 29.0-29.9,adult 11/25/2016  . 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 Nonallopathic lesion of rib cage 10/14/2016  . Nonallopathic lesion of cervical region 10/14/2016  . Nonallopathic lesion of thoracic region 10/14/2016  . Hip flexor tendinitis, right 06/17/2016  . Abnormal weight gain 09/18/2015  . GDM (gestational diabetes mellitus) 09/18/2015  . 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 Palpitations 11/24/2010  . Murmur 11/24/2010  . Dyspnea 11/24/2010    Past Surgical History:  Procedure Laterality Date  . CESAREAN SECTION    . DILATION AND EVACUATION  04/23/2011   Procedure: DILATATION AND EVACUATION (D&E);  Surgeon: Floyce Stakes. Fogleman;  Location: [facility];  Service: Gynecology;  Laterality: N/A;  Ultrasound Guidance  . laser iridotomy  Bilateral May 2015    OB History    No data available       Home Medications    Prior to Admission medications   Medication Sig Start Date End Date Taking? Authorizing Provider  albuterol (PROVENTIL HFA;VENTOLIN HFA) 108 (90 Base) MCG/ACT inhaler Inhale 1-2 puffs every 6 (six) hours as needed into the lungs for wheezing or shortness of breath. 06/25/17   Noe Gens, PA-C  cephALEXin (KEFLEX) 500 MG capsule Take 1 capsule (500 mg total) by mouth 2 (two) times daily. 08/02/17   Kandra Nicolas, MD  CRANBERRY PO Take by mouth.    [provider]  ipratropium (ATROVENT) 0.06 % nasal spray Place 2 sprays 4 (four) times daily into both nostrils. 06/25/17   Noe Gens, PA-C  Probiotic Product (PROBIOTIC PO) Take by mouth.    [provider]  promethazine-dextromethorphan (PROMETHAZINE-DM) 6.25-15 MG/5ML syrup Take 5 mLs 2 (two) times daily as needed by mouth for cough. 06/25/17   Noe Gens, PA-C    Family History Family History  Problem Relation Age of Onset  . Sudden death Mother        Age 23; ? MI  . Coronary artery disease Father        Age 50  . Diabetes Father   . Hyperlipidemia Maternal Aunt   . Pulmonary fibrosis Maternal Aunt     Social History Social History   Tobacco Use  . Smoking status: Never Smoker  . Smokeless tobacco: Never Used  Substance Use  Topics  . Alcohol use: No  . Drug use: No     Allergies   Belviq [lorcaserin hcl]   Review of Systems Review of Systems  Constitutional: Negative for fever.  Gastrointestinal: Negative for abdominal pain, nausea and vomiting.  Genitourinary: Positive for dysuria. Negative for flank pain and vaginal discharge.  All other systems reviewed and are negative.    Physical Exam Triage Vital Signs ED Triage Vitals  Enc Vitals Group     BP 08/02/17 1823 (!) 118/92     Pulse Rate 08/02/17 1823 79     Resp --      Temp 08/02/17 1823 98.2 F (36.8 C)     Temp Source 08/02/17 1823  Oral     SpO2 08/02/17 1823 98 %     Weight 08/02/17 1823 207 lb (93.9 kg)     Height --      Head Circumference --      Peak Flow --      Pain Score 08/02/17 1824 0     Pain Loc --      Pain Edu? --      Excl. in [place]? --    No data found.  Updated Vital Signs BP (!) 118/92 (BP Location: Right Arm)   Pulse 79   Temp 98.2 F (36.8 C) (Oral)   Wt 207 lb (93.9 kg)   LMP 07/17/2017 (Approximate)   SpO2 98%   BMI 31.47 kg/m   Visual Acuity Right Eye Distance:   Left Eye Distance:   Bilateral Distance:    Right Eye Near:   Left Eye Near:    Bilateral Near:     Physical Exam Nursing notes and Vital Signs reviewed. Appearance:  Patient appears stated age, and in no acute distress.    Eyes:  Pupils are equal, round, and reactive to light and accomodation.  Extraocular movement is intact.  Conjunctivae are not inflamed   Pharynx:  Normal; moist mucous membranes  Neck:  Supple.  No adenopathy Lungs:  Clear to auscultation.  Breath sounds are equal.  Moving air well. Heart:  Regular rate and rhythm without murmurs, rubs, or gallops.  Abdomen:  Nontender without masses or hepatosplenomegaly.  Bowel sounds are present.  No CVA or flank tenderness.  Extremities:  No edema.  Skin:  No rash present.     UC Treatments / Results  Labs (all labs ordered are listed, but only abnormal results are displayed) Labs Reviewed  POCT URINALYSIS DIP (MANUAL ENTRY) - Abnormal; Notable for the following components:      Result Value   Clarity, UA cloudy (*)    All other components within normal limits  URINE CULTURE    EKG  EKG Interpretation None       Radiology No results found.  Procedures Procedures (including critical care time)  Medications Ordered in UC Medications - No data to display   Initial Impression / Assessment and Plan / UC Course  I have reviewed the triage vital signs and the nursing notes.  Pertinent labs & imaging results that were available during my  care of the patient were reviewed by me and considered in my medical decision making (see chart for details).    Urine culture pending. Begin Keflex. Increase fluid intake. If symptoms become significantly worse during the night or over the weekend, proceed to the local emergency room.  Followup with Family Doctor if not improved in one week.     Final Clinical Impressions(s) / UC  Diagnoses   Final diagnoses:  Cystitis    ED Discharge Orders        Ordered    cephALEXin (KEFLEX) 500 MG capsule  2 times daily     08/02/17 1901           Kandra Nicolas, MD 08/05/17 1422

## 2017-08-02 NOTE — ED Triage Notes (Signed)
Pt c/o dysuria and frequency x2 days. States she has had several UTI in the last year.

## 2017-08-03 LAB — URINE CULTURE
MICRO NUMBER:: 81449266
Result:: NO GROWTH
SPECIMEN QUALITY:: ADEQUATE

## 2017-08-03 MED FILL — CEPHALEXIN 500 MG CAPSULE: 500 | 7 days supply | Qty: 14 | Fill #0

## 2017-08-04 ENCOUNTER — Telehealth: Payer: Self-pay

## 2017-08-04 NOTE — Telephone Encounter (Signed)
Feeling better.  Notified of lab results.

## 2017-12-05 ENCOUNTER — Encounter: Payer: Self-pay | Admitting: Physician Assistant

## 2017-12-05 ENCOUNTER — Ambulatory Visit (INDEPENDENT_AMBULATORY_CARE_PROVIDER_SITE_OTHER): Payer: 59 | Admitting: Physician Assistant

## 2017-12-05 VITALS — BP 120/77 | HR 71 | Ht 68.0 in | Wt 211.0 lb

## 2017-12-05 DIAGNOSIS — Z0000 Encounter for general adult medical examination without abnormal findings: Secondary | ICD-10-CM | POA: Diagnosis not present

## 2017-12-05 DIAGNOSIS — Z13220 Encounter for screening for lipoid disorders: Secondary | ICD-10-CM | POA: Diagnosis not present

## 2017-12-05 DIAGNOSIS — Z131 Encounter for screening for diabetes mellitus: Secondary | ICD-10-CM | POA: Diagnosis not present

## 2017-12-05 DIAGNOSIS — L659 Nonscarring hair loss, unspecified: Secondary | ICD-10-CM | POA: Insufficient documentation

## 2017-12-05 LAB — COMPLETE METABOLIC PANEL WITH GFR
AG Ratio: 1.7 (calc) (ref 1.0–2.5)
ALKALINE PHOSPHATASE (APISO): 57 U/L (ref 33–115)
ALT: 11 U/L (ref 6–29)
AST: 12 U/L (ref 10–30)
Albumin: 4.2 g/dL (ref 3.6–5.1)
BUN: 12 mg/dL (ref 7–25)
CALCIUM: 9.1 mg/dL (ref 8.6–10.2)
CO2: 28 mmol/L (ref 20–32)
CREATININE: 0.66 mg/dL (ref 0.50–1.10)
Chloride: 103 mmol/L (ref 98–110)
GFR, EST NON AFRICAN AMERICAN: 107 mL/min/{1.73_m2} (ref >=60)
GFR, Est African American: 125 mL/min/{1.73_m2} (ref >=60)
GLUCOSE: 94 mg/dL (ref 65–99)
Globulin: 2.5 g/dL (calc) (ref 1.9–3.7)
Potassium: 4.2 mmol/L (ref 3.5–5.3)
Sodium: 139 mmol/L (ref 135–146)
Total Bilirubin: 0.9 mg/dL (ref 0.2–1.2)
Total Protein: 6.7 g/dL (ref 6.1–8.1)

## 2017-12-05 LAB — LIPID PANEL W/REFLEX DIRECT LDL
CHOLESTEROL: 169 mg/dL (ref <200)
HDL: 57 mg/dL (ref >50)
LDL Cholesterol (Calc): 97 mg/dL (calc)
NON-HDL CHOLESTEROL (CALC): 112 mg/dL (ref <130)
TRIGLYCERIDES: 61 mg/dL (ref <150)
Total CHOL/HDL Ratio: 3 (calc) (ref <5.0)

## 2017-12-05 LAB — CBC WITH DIFFERENTIAL/PLATELET
Basophils Absolute: 59 cells/uL (ref 0–200)
Basophils Relative: 0.9 %
EOS PCT: 2.3 %
Eosinophils Absolute: 152 cells/uL (ref 15–500)
HEMATOCRIT: 41.1 % (ref 35.0–45.0)
Hemoglobin: 14.2 g/dL (ref 11.7–15.5)
LYMPHS ABS: 1584 {cells}/uL (ref 850–3900)
MCH: 29.9 pg (ref 27.0–33.0)
MCHC: 34.5 g/dL (ref 32.0–36.0)
MCV: 86.5 fL (ref 80.0–100.0)
MONOS PCT: 7.3 %
MPV: 10.9 fL (ref 7.5–12.5)
Neutro Abs: 4323 cells/uL (ref 1500–7800)
Neutrophils Relative %: 65.5 %
PLATELETS: 238 10*3/uL (ref 140–400)
RBC: 4.75 10*6/uL (ref 3.80–5.10)
RDW: 12.7 % (ref 11.0–15.0)
TOTAL LYMPHOCYTE: 24 %
WBC mixed population: 482 cells/uL (ref 200–950)
WBC: 6.6 10*3/uL (ref 3.8–10.8)

## 2017-12-05 LAB — TSH: TSH: 2.1 m[IU]/L

## 2017-12-05 NOTE — Progress Notes (Signed)
Subjective:     [name] is a 42 y.o. female and is here for a comprehensive physical exam. The patient reports hair thinning for approximately the past 9 months. She has not had any recent diet changes or medication changes. She describes the hair loss as being throughout her hair. She has had periods of increased stress over the past year, including 2 moves and a tree falling on her house recently. She has also not been taking a vitamin B12 supplement which she was found to be deficient in at her physical last year. She does not have a family history of hair loss.  She also states that she has had several UTIs in the past year that required multiple treatments and seemed to be around the time of her menstrual cycle. However she has not had one in the past 2 months and is not currently having any symptoms including frequency, urgency, or dysuria.   Social History   Socioeconomic History  . Marital status: Married    Spouse name: Not on file  . Number of children: 1  . Years of education: Not on file  . Highest education level: Not on file  Occupational History    Employer: [employer]  . Financial resource strain: Not on file  . Food insecurity:    Worry: Not on file    Inability: Not on file  . Transportation needs:    Medical: Not on file    Non-medical: Not on file  Tobacco Use  . Smoking status: Never Smoker  . Smokeless tobacco: Never Used  Substance and Sexual Activity  . Alcohol use: No  . Drug use: No  . Sexual activity: Yes  Lifestyle  . Physical activity:    Days per week: Not on file    Minutes per session: Not on file  . Stress: Not on file  Relationships  . Social connections:    Talks on phone: Not on file    Gets together: Not on file    Attends religious service: Not on file    Active member of club or organization: Not on file    Attends meetings of clubs or organizations: Not on file    Relationship status: Not on file  . Intimate  partner violence:    Fear of current or ex partner: Not on file    Emotionally abused: Not on file    Physically abused: Not on file    Forced sexual activity: Not on file  Other Topics Concern  . Not on file  Social History Narrative  . Not on file   Health Maintenance  Topic Date Due  . PAP SMEAR  08/08/2016  . MAMMOGRAM  12/10/2016  . INFLUENZA VACCINE  03/08/2018  . TETANUS/TDAP  08/08/2020  . HIV Screening  Completed    Review of Systems Pertinent items noted in HPI and remainder of comprehensive ROS otherwise negative.   Objective:     Physical Exam  Constitutional: She is oriented to person, place, and time. She appears well-developed and well-nourished.  HENT:  Head: Normocephalic and atraumatic.  Right Ear: Tympanic membrane and external ear normal.  Left Ear: Tympanic membrane and external ear normal.  Nose: Nose normal.  Mouth/Throat: Uvula is midline and oropharynx is clear and moist.  Eyes: Pupils are equal, round, and reactive to light. Conjunctivae and EOM are normal.  Neck: Normal range of motion. Neck supple.  Cardiovascular: Normal rate, regular rhythm, normal heart sounds and  intact distal pulses.  Pulmonary/Chest: Effort normal and breath sounds normal.  Abdominal: Soft. Bowel sounds are normal.  Neurological: She is alert and oriented to person, place, and time.  Skin: Skin is warm and dry.  Psychiatric: She has a normal mood and affect. Her behavior is normal. Judgment and thought content normal.      Assessment:    Healthy female exam.     Plan:  Marland KitchenMarland KitchenDaphne was seen today for annual exam.  Diagnoses and all orders for this visit:  Routine physical examination -     TSH -     CBC with Differential/Platelet -     COMPLETE METABOLIC PANEL WITH GFR -     Lipid Panel w/reflex Direct LDL -     B12 and Folate Panel -     Vitamin D 1,25 dihydroxy -     Testosterone -     Ferritin  Hair loss -     TSH -     CBC with Differential/Platelet -      B12 and Folate Panel -     Vitamin D 1,25 dihydroxy -     Testosterone -     Ferritin  Screening for diabetes mellitus -     COMPLETE METABOLIC PANEL WITH GFR  Screening for lipid disorders -     Lipid Panel w/reflex Direct LDL   .[name] Depression screen [facility] 2/9 12/05/2017 11/25/2016  Decreased Interest 0 0  Down, Depressed, Hopeless 0 0  PHQ - 2 Score 0 0  Altered sleeping 0 -  Tired, decreased energy 1 -  Change in appetite 0 -  Feeling bad or failure about yourself  0 -  Trouble concentrating 0 -  Moving slowly or fidgety/restless 0 -  Suicidal thoughts 0 -  PHQ-9 Score 1 -  Difficult doing work/chores Not difficult at all -   .. Discussed 150 minutes of exercise a week.  Encouraged vitamin D 1000 units and Calcium 1349m or 4 servings of dairy a day.  Fasting labs ordered.  Mammogram and pap up to date per patient reporting. We are attempting to get records.    Hair loss-  Advised patient to start taking Biotin supplement. If all labs are normal and hair loss continues, will consider alternative therapies such as Rogaine.   See After Visit Summary for Counseling Recommendations

## 2017-12-05 NOTE — Patient Instructions (Signed)
Keeping You Healthy  Get These Tests 1. Blood Pressure- Have your blood pressure checked once a year by your health care provider.  Normal blood pressure is 120/80. 2. Weight- Have your body mass index (BMI) calculated to screen for obesity.  BMI is measure of body fat based on height and weight.  You can also calculate your own BMI at GravelBags.it. 3. Cholesterol- Have your cholesterol checked every 5 years starting at age 44 then yearly starting at age 64. 88. Chlamydia, HIV, and other sexually transmitted diseases- Get screened every year until age 51, then within three months of each new sexual provider. 5. Pap Test - Every 1-5 years; discuss with your health care provider. 6. Mammogram- Every 1-2 years starting at age 95--50  Take these medicines  Calcium with Vitamin D-Your body needs 1200 mg of Calcium each day and 3250223281 IU of Vitamin D daily.  Your body can only absorb 500 mg of Calcium at a time so Calcium must be taken in 2 or 3 divided doses throughout the day.  Multivitamin with folic acid- Once daily if it is possible for you to become pregnant.  Get these Immunizations  Gardasil-Series of three doses; prevents HPV related illness such as genital warts and cervical cancer.  Menactra-Single dose; prevents meningitis.  Tetanus shot- Every 10 years.  Flu shot-Every year.  Take these steps 1. Do not smoke-Your healthcare provider can help you quit.  For tips on how to quit go to www.smokefree.gov or call 1-800 QUITNOW. 2. Be physically active- Exercise 5 days a week for at least 30 minutes.  If you are not already physically active, start slow and gradually work up to 30 minutes of moderate physical activity.  Examples of moderate activity include walking briskly, dancing, swimming, bicycling, etc. 3. Breast Cancer- A self breast exam every month is important for early detection of breast cancer.  For more information and instruction on self breast exams, ask your  healthcare provider or https://www.patel.info/. 4. Eat a healthy diet- Eat a variety of healthy foods such as fruits, vegetables, whole grains, low fat milk, low fat cheeses, yogurt, lean meats, poultry and fish, beans, nuts, tofu, etc.  For more information go to www. Thenutritionsource.org 5. Drink alcohol in moderation- Limit alcohol intake to one drink or less per day. Never drink and drive. 6. Depression- Your emotional health is as important as your physical health.  If you're feeling down or losing interest in things you normally enjoy please talk to your healthcare provider about being screened for depression. 7. Dental visit- Brush and floss your teeth twice daily; visit your dentist twice a year. 8. Eye doctor- Get an eye exam at least every 2 years. 9. Helmet use- Always wear a helmet when riding a bicycle, motorcycle, rollerblading or skateboarding. 64. Safe sex- If you may be exposed to sexually transmitted infections, use a condom. 11. Seat belts- Seat belts can save your live; always wear one. 12. Smoke/Carbon Monoxide detectors- These detectors need to be installed on the appropriate level of your home. Replace batteries at least once a year. 13. Skin cancer- When out in the sun please cover up and use sunscreen 15 SPF or higher. 14. Violence- If anyone is threatening or hurting you, please tell your healthcare provider.

## 2017-12-06 LAB — B12 AND FOLATE PANEL
FOLATE: 10.5 ng/mL
Vitamin B-12: 452 pg/mL (ref 200–1100)

## 2017-12-06 LAB — FERRITIN: FERRITIN: 22 ng/mL (ref 10–232)

## 2017-12-06 NOTE — Progress Notes (Signed)
Call pt:  Vitamin D and testosterone pending.   All other labs look great. No causes of hair loss.

## 2017-12-07 LAB — TESTOSTERONE, TOTAL, LC/MS/MS: Testosterone, Total, LC-MS-MS: 18 ng/dL (ref 2–45)

## 2017-12-07 NOTE — Progress Notes (Signed)
Testosterone is normal limits.

## 2017-12-08 LAB — VITAMIN D 1,25 DIHYDROXY
VITAMIN D 1, 25 (OH) TOTAL: 60 pg/mL (ref 18–72)
Vitamin D2 1, 25 (OH)2: <8 pg/mL
Vitamin D3 1, 25 (OH)2: 60 pg/mL

## 2017-12-08 NOTE — Progress Notes (Signed)
Vitamin D looks great.

## 2017-12-15 ENCOUNTER — Other Ambulatory Visit: Payer: Self-pay | Admitting: Physician Assistant

## 2017-12-15 DIAGNOSIS — Z1231 Encounter for screening mammogram for malignant neoplasm of breast: Secondary | ICD-10-CM

## 2017-12-27 ENCOUNTER — Telehealth (HOSPITAL_COMMUNITY): Payer: Self-pay | Admitting: *Deleted

## 2017-12-27 MED ORDER — SULFAMETHOXAZOLE-TRIMETHOPRIM 800-160 MG PO TABS: 1.0000 | ORAL_TABLET | Freq: Two times a day (BID) | ORAL | 0 refills | Status: AC

## 2017-12-27 MED FILL — SULFAMETHOXAZOLE-TMP DS TAB: 800-160 | 7 days supply | Qty: 14 | Fill #0

## 2017-12-27 NOTE — Telephone Encounter (Signed)
Per Dr Marcellina Millin DS sent in

## 2017-12-29 DIAGNOSIS — L738 Other specified follicular disorders: Secondary | ICD-10-CM | POA: Diagnosis not present

## 2017-12-29 DIAGNOSIS — D1801 Hemangioma of skin and subcutaneous tissue: Secondary | ICD-10-CM | POA: Diagnosis not present

## 2017-12-29 DIAGNOSIS — D225 Melanocytic nevi of trunk: Secondary | ICD-10-CM | POA: Diagnosis not present

## 2017-12-29 DIAGNOSIS — L814 Other melanin hyperpigmentation: Secondary | ICD-10-CM | POA: Diagnosis not present

## 2017-12-29 DIAGNOSIS — D2372 Other benign neoplasm of skin of left lower limb, including hip: Secondary | ICD-10-CM | POA: Diagnosis not present

## 2017-12-29 DIAGNOSIS — L858 Other specified epidermal thickening: Secondary | ICD-10-CM | POA: Diagnosis not present

## 2017-12-29 DIAGNOSIS — L821 Other seborrheic keratosis: Secondary | ICD-10-CM | POA: Diagnosis not present

## 2017-12-29 DIAGNOSIS — D2361 Other benign neoplasm of skin of right upper limb, including shoulder: Secondary | ICD-10-CM | POA: Diagnosis not present

## 2017-12-29 DIAGNOSIS — D2272 Melanocytic nevi of left lower limb, including hip: Secondary | ICD-10-CM | POA: Diagnosis not present

## 2018-01-12 ENCOUNTER — Other Ambulatory Visit: Payer: Self-pay | Admitting: Obstetrics

## 2018-01-12 DIAGNOSIS — N6320 Unspecified lump in the left breast, unspecified quadrant: Secondary | ICD-10-CM

## 2018-01-12 DIAGNOSIS — Z1231 Encounter for screening mammogram for malignant neoplasm of breast: Secondary | ICD-10-CM

## 2018-01-15 ENCOUNTER — Other Ambulatory Visit: Payer: Self-pay | Admitting: Obstetrics

## 2018-01-15 DIAGNOSIS — N644 Mastodynia: Secondary | ICD-10-CM

## 2018-01-15 DIAGNOSIS — N6320 Unspecified lump in the left breast, unspecified quadrant: Secondary | ICD-10-CM

## 2018-02-06 ENCOUNTER — Ambulatory Visit
Admission: RE | Admit: 2018-02-06 | Discharge: 2018-02-06 | Disposition: A | Discharge status: Home / Self Care | Payer: 59 | Source: Ambulatory Visit | Attending: Obstetrics | Admitting: Obstetrics

## 2018-02-06 ENCOUNTER — Other Ambulatory Visit: Payer: Self-pay | Admitting: Obstetrics

## 2018-02-06 DIAGNOSIS — N6320 Unspecified lump in the left breast, unspecified quadrant: Secondary | ICD-10-CM

## 2018-02-06 DIAGNOSIS — N644 Mastodynia: Secondary | ICD-10-CM

## 2018-02-06 DIAGNOSIS — R922 Inconclusive mammogram: Secondary | ICD-10-CM | POA: Diagnosis not present

## 2018-02-06 DIAGNOSIS — N6002 Solitary cyst of left breast: Secondary | ICD-10-CM | POA: Diagnosis not present

## 2018-02-21 ENCOUNTER — Ambulatory Visit (INDEPENDENT_AMBULATORY_CARE_PROVIDER_SITE_OTHER): Payer: 59 | Admitting: Family Medicine

## 2018-02-21 ENCOUNTER — Encounter: Payer: Self-pay | Admitting: Family Medicine

## 2018-02-21 VITALS — BP 110/80 | HR 89 | Ht 68.0 in | Wt 212.0 lb

## 2018-02-21 DIAGNOSIS — M999 Biomechanical lesion, unspecified: Secondary | ICD-10-CM | POA: Diagnosis not present

## 2018-02-21 DIAGNOSIS — M940 Chondrocostal junction syndrome [Tietze]: Secondary | ICD-10-CM | POA: Diagnosis not present

## 2018-02-21 NOTE — Patient Instructions (Signed)
God to see you  Get back to the routine You should do well  Lets see you again in 4 weeks

## 2018-02-21 NOTE — Assessment & Plan Note (Signed)
Decision today to treat with OMT was based on Physical Exam  After verbal consent patient was treated with HVLA, ME, FPR techniques in cervical, thoracic, rib areas  Patient tolerated the procedure well with improvement in symptoms  Patient given exercises, stretches and lifestyle modifications  See medications in patient instructions if given  Patient will follow up in 4-8 weeks

## 2018-02-21 NOTE — Progress Notes (Signed)
[name] [name], [name] [address] Phone: [phone] Subjective:    CC: Back pain  [id number]  [name] is a 42 y.o. female coming in with complaint of back pain. She was doing better but had a tree fall on her house and has been under a lot of stress. Had to move items, has been sleeping on various beds with different pillows. Is finally back in her house but has been feeling sharp pain between the scapula for 2 weeks. Does not radiate.       Past Medical History:  Diagnosis Date  . Gestational diabetes   . Heart murmur    during pregnancy  . Preeclampsia    Past Surgical History:  Procedure Laterality Date  . CESAREAN SECTION    . DILATION AND EVACUATION  04/23/2011   Procedure: DILATATION AND EVACUATION (D&E);  Surgeon: Floyce Stakes. Fogleman;  Location: [facility];  Service: Gynecology;  Laterality: N/A;  Ultrasound Guidance  . laser iridotomy Bilateral May 2015   Social History   Socioeconomic History  . Marital status: Married    Spouse name: Not on file  . Number of children: 1  . Years of education: Not on file  . Highest education level: Not on file  Occupational History    Employer: [employer]  . Financial resource strain: Not on file  . Food insecurity:    Worry: Not on file    Inability: Not on file  . Transportation needs:    Medical: Not on file    Non-medical: Not on file  Tobacco Use  . Smoking status: Never Smoker  . Smokeless tobacco: Never Used  Substance and Sexual Activity  . Alcohol use: No  . Drug use: No  . Sexual activity: Yes  Lifestyle  . Physical activity:    Days per week: Not on file    Minutes per session: Not on file  . Stress: Not on file  Relationships  . Social connections:    Talks on phone: Not on file    Gets together: Not on file    Attends religious service: Not on file    Active member of club or organization: Not on file    Attends meetings of clubs or  organizations: Not on file    Relationship status: Not on file  Other Topics Concern  . Not on file  Social History Narrative  . Not on file   Allergies  Allergen Reactions  . Belviq [Lorcaserin Hcl]     Crazy dreams   Family History  Problem Relation Age of Onset  . Sudden death Mother        Age 56; ? MI  . Coronary artery disease Father        Age 40  . Diabetes Father   . Hyperlipidemia Maternal Aunt   . Pulmonary fibrosis Maternal Aunt      Past medical history, social, surgical and family history all reviewed in electronic medical record.  No pertanent information unless stated regarding to the chief complaint.   Review of Systems:Review of systems updated and as accurate as of 02/21/18  No headache, visual changes, nausea, vomiting, diarrhea, constipation, dizziness, abdominal pain, skin rash, fevers, chills, night sweats, weight loss, swollen lymph nodes, body aches, joint swelling,  chest pain, shortness of breath, mood changes.  Positive muscle aches  Objective  Blood pressure 110/80, pulse 89, height 5' 8"  (1.727 m), weight 212 lb (  96.2 kg), SpO2 97 %. Systems examined below as of 02/21/18   General: No apparent distress alert and oriented x3 mood and affect normal, dressed appropriately.  HEENT: Pupils equal, extraocular movements intact  Respiratory: Patient's speak in full sentences and does not appear short of breath  Cardiovascular: No lower extremity edema, non tender, no erythema  Skin: Warm dry intact with no signs of infection or rash on extremities or on axial skeleton.  Abdomen: Soft nontender  Neuro: Cranial nerves II through XII are intact, neurovascularly intact in all extremities with 2+ DTRs and 2+ pulses.  Lymph: No lymphadenopathy of posterior or anterior cervical chain or axillae bilaterally.  Gait normal with good balance and coordination.  MSK:  Non tender with full range of motion and good stability and symmetric strength and tone of  shoulders, elbows, wrist, hip, knee and ankles bilaterally.  Neck: Inspection loss of lordosis. No palpable stepoffs. Negative Spurling's maneuver. Patient does have some moderate discomfort with side bending of the neck bilaterally Grip strength and sensation normal in bilateral hands Strength good C4 to T1 distribution No sensory change to C4 to T1 Negative Hoffman sign bilaterally Reflexes normal Significant tightness of the scapular region bilaterally right greater than left  Osteopathic findings C2 flexed rotated and side bent right C4 flexed rotated and side bent left T3 extended rotated and side bent right inhaled third rib T7 extended rotated and side bent left       Impression and Recommendations:     This case required medical decision making of moderate complexity.      Note: This dictation was prepared with Dragon dictation along with smaller phrase technology. Any transcriptional errors that result from this process are unintentional.

## 2018-02-21 NOTE — Assessment & Plan Note (Signed)
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  Discussed icing regimen and home exercise.  Responded very well to osteopathic manipulation again today.  We discussed which activities to do which wants to avoid.  Follow-up again in 4 to 8 weeks

## 2018-03-09 ENCOUNTER — Ambulatory Visit (INDEPENDENT_AMBULATORY_CARE_PROVIDER_SITE_OTHER): Payer: 59 | Admitting: Family Medicine

## 2018-03-09 ENCOUNTER — Encounter: Payer: Self-pay | Admitting: Family Medicine

## 2018-03-09 ENCOUNTER — Ambulatory Visit (INDEPENDENT_AMBULATORY_CARE_PROVIDER_SITE_OTHER)
Admission: RE | Admit: 2018-03-09 | Discharge: 2018-03-09 | Disposition: A | Discharge status: Home / Self Care | Payer: 59 | Source: Ambulatory Visit | Attending: Family Medicine | Admitting: Family Medicine

## 2018-03-09 VITALS — BP 138/84 | HR 76 | Ht 68.0 in | Wt 208.0 lb

## 2018-03-09 DIAGNOSIS — M5416 Radiculopathy, lumbar region: Secondary | ICD-10-CM | POA: Diagnosis not present

## 2018-03-09 DIAGNOSIS — M7602 Gluteal tendinitis, left hip: Secondary | ICD-10-CM

## 2018-03-09 DIAGNOSIS — M545 Low back pain: Secondary | ICD-10-CM | POA: Diagnosis not present

## 2018-03-09 MED ORDER — NITROGLYCERIN 0.2 MG/HR TD PT24: MEDICATED_PATCH | TRANSDERMAL | 1 refills | Status: DC

## 2018-03-09 MED ORDER — VITAMIN D (ERGOCALCIFEROL) 1.25 MG (50000 UNIT) PO CAPS: 50000.0000 [IU] | ORAL_CAPSULE | ORAL | 0 refills | Status: DC

## 2018-03-09 MED FILL — VIT D2 1.25 MG (50,000 UNIT: 1.25 MG | 84 days supply | Qty: 12 | Fill #0

## 2018-03-09 MED FILL — NITROGLYCERIN 0.2 MG/HR PAT: 0.2 | 84 days supply | Qty: 21 | Fill #0

## 2018-03-09 NOTE — Progress Notes (Signed)
[name] [address], [place] [address] Phone: [phone] Subjective:     CC: Left hip pain  DHR:[id number]  [name] is a 42 y.o. female coming in with complaint of hip pain. Left hip pain in glute. Ran on Saturday and pain increased since then. Running was uphill. Stretched and iced over weekend. Had a hard time with hip flexion by Monday. Pain did improve over the week. Can cause sharp pain with palpation of her glute. Pain 2/10 today was 8/10. Did have a hard time sleeping. Shooting pain down her leg into her foot left side.        Past Medical History:  Diagnosis Date  . Gestational diabetes   . Heart murmur    during pregnancy  . Preeclampsia    Past Surgical History:  Procedure Laterality Date  . CESAREAN SECTION    . DILATION AND EVACUATION  04/23/2011   Procedure: DILATATION AND EVACUATION (D&E);  Surgeon: Floyce Stakes. Fogleman;  Location: [facility];  Service: Gynecology;  Laterality: N/A;  Ultrasound Guidance  . laser iridotomy Bilateral May 2015   Social History   Socioeconomic History  . Marital status: Married    Spouse name: Not on file  . Number of children: 1  . Years of education: Not on file  . Highest education level: Not on file  Occupational History    Employer: [employer]  . Financial resource strain: Not on file  . Food insecurity:    Worry: Not on file    Inability: Not on file  . Transportation needs:    Medical: Not on file    Non-medical: Not on file  Tobacco Use  . Smoking status: Never Smoker  . Smokeless tobacco: Never Used  Substance and Sexual Activity  . Alcohol use: No  . Drug use: No  . Sexual activity: Yes  Lifestyle  . Physical activity:    Days per week: Not on file    Minutes per session: Not on file  . Stress: Not on file  Relationships  . Social connections:    Talks on phone: Not on file    Gets together: Not on file    Attends religious service: Not on file     Active member of club or organization: Not on file    Attends meetings of clubs or organizations: Not on file    Relationship status: Not on file  Other Topics Concern  . Not on file  Social History Narrative  . Not on file   Allergies  Allergen Reactions  . Belviq [Lorcaserin Hcl]     Crazy dreams   Family History  Problem Relation Age of Onset  . Sudden death Mother        Age 73; ? MI  . Coronary artery disease Father        Age 62  . Diabetes Father   . Hyperlipidemia Maternal Aunt   . Pulmonary fibrosis Maternal Aunt      Past medical history, social, surgical and family history all reviewed in electronic medical record.  No pertanent information unless stated regarding to the chief complaint.   Review of Systems:Review of systems updated and as accurate as of 03/09/18  No headache, visual changes, nausea, vomiting, diarrhea, constipation, dizziness, abdominal pain, skin rash, fevers, chills, night sweats, weight loss, swollen lymph nodes, body aches, joint swelling, muscle aches, chest pain, shortness of breath, mood changes.   Objective  Blood pressure 138/84, pulse 76, height 5' 8"  (1.727 m), weight 208 lb (94.3 kg), last menstrual period 03/07/2018, SpO2 97 %. Systems examined below as of 03/09/18   General: No apparent distress alert and oriented x3 mood and affect normal, dressed appropriately.  HEENT: Pupils equal, extraocular movements intact  Respiratory: Patient's speak in full sentences and does not appear short of breath  Cardiovascular: No lower extremity edema, non tender, no erythema  Skin: Warm dry intact with no signs of infection or rash on extremities or on axial skeleton.  Abdomen: Soft nontender  Neuro: Cranial nerves II through XII are intact, neurovascularly intact in all extremities with 2+ DTRs and 2+ pulses.  Lymph: No lymphadenopathy of posterior or anterior cervical chain or axillae bilaterally.  Gait normal with good balance and  coordination.  MSK:  Non tender with full range of motion and good stability and symmetric strength and tone of shoulders, elbows, wrist,  knee and ankles bilaterally.  Left hip has a negative straight leg test but does have a positive Corky Sox.  Severely tender to palpation over the include medially on the left side.  Mild over the lateral aspect of the sacral area.  No pain over the greater trochanteric area.    Impression and Recommendations:     This case required medical decision making of moderate complexity.      Note: This dictation was prepared with Dragon dictation along with smaller phrase technology. Any transcriptional errors that result from this process are unintentional.

## 2018-03-09 NOTE — Assessment & Plan Note (Signed)
Patient actually had a tear noted.  Discussed with patient about home exercise, hip abduction, which activities to do which wants to avoid.  Follow-up again in 4 weeks

## 2018-03-09 NOTE — Patient Instructions (Addendum)
Good to see you  Ice 20 minutes 2 times daily. Usually after activity and before bed. Gabapentin 256m at night Nitroglycerin Protocol   Apply 1/4 nitroglycerin patch to affected area daily.  Change position of patch within the affected area every 24 hours.  You may experience a headache during the first 1-2 weeks of using the patch, these should subside.  If you experience headaches after beginning nitroglycerin patch treatment, you may take your preferred over the counter pain reliever.  Another side effect of the nitroglycerin patch is skin irritation or rash related to patch adhesive.  Please notify our office if you develop more severe headaches or rash, and stop the patch.  Tendon healing with nitroglycerin patch may require 12 to 24 weeks depending on the extent of injury.  Men should not use if taking Viagra, Cialis, or Levitra.   Do not use if you have migraines or rosacea. Once weekly vitamin D for 12 weeks Exercises 3 times a week.   See me again in 4 weeks

## 2018-03-21 ENCOUNTER — Ambulatory Visit: Payer: 59 | Admitting: Family Medicine

## 2018-03-22 DIAGNOSIS — Z01419 Encounter for gynecological examination (general) (routine) without abnormal findings: Secondary | ICD-10-CM | POA: Diagnosis not present

## 2018-03-22 DIAGNOSIS — Z6832 Body mass index (BMI) 32.0-32.9, adult: Secondary | ICD-10-CM | POA: Diagnosis not present

## 2018-04-05 NOTE — Progress Notes (Signed)
[name] [address], [place] [address] Phone: [phone] Subjective:   [name], am serving as a scribe for Dr. Hulan Saas.   CC: Back pain follow-up  [id number]  [name] is a 42 y.o. female coming in with complaint of back pain. Her glute pain has subsided. Here for OMT today.  States that it is feeling significantly better at this moment.  Some mild tightness but nothing severe.  Trying to stay active.    Past Medical History:  Diagnosis Date  . Gestational diabetes   . Heart murmur    during pregnancy  . Preeclampsia    Past Surgical History:  Procedure Laterality Date  . CESAREAN SECTION    . DILATION AND EVACUATION  04/23/2011   Procedure: DILATATION AND EVACUATION (D&E);  Surgeon: Floyce Stakes. Fogleman;  Location: [facility];  Service: Gynecology;  Laterality: N/A;  Ultrasound Guidance  . laser iridotomy Bilateral May 2015   Social History   Socioeconomic History  . Marital status: Married    Spouse name: Not on file  . Number of children: 1  . Years of education: Not on file  . Highest education level: Not on file  Occupational History    Employer: [employer]  . Financial resource strain: Not on file  . Food insecurity:    Worry: Not on file    Inability: Not on file  . Transportation needs:    Medical: Not on file    Non-medical: Not on file  Tobacco Use  . Smoking status: Never Smoker  . Smokeless tobacco: Never Used  Substance and Sexual Activity  . Alcohol use: [name]  . Drug use: [name]  . Sexual activity: Yes  Lifestyle  . Physical activity:    Days per week: Not on file    Minutes per session: Not on file  . Stress: Not on file  Relationships  . Social connections:    Talks on phone: Not on file    Gets together: Not on file    Attends religious service: Not on file    Active member of club or organization: Not on file    Attends meetings of clubs or organizations: Not on file      Relationship status: Not on file  Other Topics Concern  . Not on file  Social History Narrative  . Not on file   Allergies  Allergen Reactions  . Belviq [Lorcaserin Hcl]     Crazy dreams   Family History  Problem Relation Age of Onset  . Sudden death Mother        Age 74; ? MI  . Coronary artery disease Father        Age 49  . Diabetes Father   . Hyperlipidemia Maternal Aunt   . Pulmonary fibrosis Maternal Aunt      Current Outpatient Medications (Cardiovascular):  .  nitroGLYCERIN (NITRODUR - DOSED IN MG/24 HR) 0.2 mg/hr patch, 1/4 patch daily     Current Outpatient Medications (Other):  [name]  Ascorbic Acid (VITAMIN C PO), Take by mouth. .  CRANBERRY PO, Take by mouth. .  Probiotic Product (PROBIOTIC PO), Take by mouth. .  Vitamin D, Ergocalciferol, (DRISDOL) 50000 units CAPS capsule, Take 1 capsule (50,000 Units total) by mouth every 7 (seven) days.    Past medical history, social, surgical and family history all reviewed in electronic medical record.  [name] pertanent information unless stated regarding to the chief  complaint.   Review of Systems:  [name] headache, visual changes, nausea, vomiting, diarrhea, constipation, dizziness, abdominal pain, skin rash, fevers, chills, night sweats, weight loss, swollen lymph nodes, body aches, joint swelling, , chest pain, shortness of breath, mood changes.  Mild positive muscle aches  Objective  Blood pressure 122/82, pulse 90, height 5' 8"  (1.727 m), weight 208 lb (94.3 kg), last menstrual period 03/07/2018, SpO2 98 %. Systems examined below as of    General: [name] apparent distress alert and oriented x3 mood and affect normal, dressed appropriately.  HEENT: Pupils equal, extraocular movements intact  Respiratory: Patient's speak in full sentences and does not appear short of breath  Cardiovascular: [name] lower extremity edema, non tender, [name] erythema  Skin: Warm dry intact with [name] signs of infection or rash on extremities or on axial  skeleton.  Abdomen: Soft nontender  Neuro: Cranial nerves II through XII are intact, neurovascularly intact in all extremities with 2+ DTRs and 2+ pulses.  Lymph: [name] lymphadenopathy of posterior or anterior cervical chain or axillae bilaterally.  Gait normal with good balance and coordination.  MSK:  Non tender with full range of motion and good stability and symmetric strength and tone of shoulders, elbows, wrist, hip, knee and ankles bilaterally.  Back Exam:  Inspection: Loss of lordosis Motion: Flexion 45 deg, Extension 25 deg, Side Bending to 35 deg bilaterally,  Rotation to 35 deg bilaterally  SLR laying: Negative  XSLR laying: Negative  Palpable tenderness: Tender to palpation the paraspinal muscular lumbar spine right greater than left. FABER: Tightness bilaterally left greater than right. Sensory change: Gross sensation intact to all lumbar and sacral dermatomes.  Reflexes: 2+ at both patellar tendons, 2+ at achilles tendons, Babinski's downgoing.  Strength at foot  Plantar-flexion: 5/5 Dorsi-flexion: 5/5 Eversion: 5/5 Inversion: 5/5  Leg strength  Quad: 5/5 Hamstring: 5/5 Hip flexor: 5/5 Hip abductors: 5/5  Gait unremarkable.  Osteopathic findings C2 flexed rotated and side bent right C6 flexed rotated and side bent left T3 extended rotated and side bent right inhaled third rib T6 extended rotated and side bent left L3 flexed rotated and side bent right Sacrum right on right    Impression and Recommendations:     This case required medical decision making of moderate complexity. The above documentation has been reviewed and is accurate and complete Lyndal Pulley, DO       Note: This dictation was prepared with Dragon dictation along with smaller phrase technology. Any transcriptional errors that result from this process are unintentional.

## 2018-04-06 ENCOUNTER — Encounter: Payer: Self-pay | Admitting: Family Medicine

## 2018-04-06 ENCOUNTER — Ambulatory Visit (INDEPENDENT_AMBULATORY_CARE_PROVIDER_SITE_OTHER): Payer: 59 | Admitting: Family Medicine

## 2018-04-06 DIAGNOSIS — M999 Biomechanical lesion, unspecified: Secondary | ICD-10-CM | POA: Diagnosis not present

## 2018-04-06 DIAGNOSIS — M7602 Gluteal tendinitis, left hip: Secondary | ICD-10-CM

## 2018-04-06 DIAGNOSIS — M940 Chondrocostal junction syndrome [Tietze]: Secondary | ICD-10-CM | POA: Diagnosis not present

## 2018-04-06 NOTE — Assessment & Plan Note (Signed)
Decision today to treat with OMT was based on Physical Exam  After verbal consent patient was treated with HVLA, ME, FPR techniques in cervical, thoracic, rib, lumbar and sacral areas  Patient tolerated the procedure well with improvement in symptoms  Patient given exercises, stretches and lifestyle modifications  See medications in patient instructions if given  Patient will follow up in 8-12 weeks

## 2018-04-06 NOTE — Assessment & Plan Note (Signed)
Significant improvement noted today.  Continue with the vitamin D at the moment as well as the nitroglycerin

## 2018-04-06 NOTE — Assessment & Plan Note (Signed)
Stable overall.  Did not need another osteopathic manipulation again today though.  Discussed continuing the vitamin D.  Discussed continuing the icing regimen and home exercises.  Patient will continue with osteopathic manipulation.  Follow-up again in 12 weeks

## 2018-04-06 NOTE — Patient Instructions (Signed)
Good to see you  Ice is your friend Feel better See me again in 2-3 months!

## 2018-08-05 NOTE — Progress Notes (Signed)
[name] [address], [place] [address] Phone: [phone] Subjective:   [name], am serving as a scribe for Dr. Hulan Saas.   CC: Back pain follow-up  [id number]  [name] is a 42 y.o. female coming in with complaint of back pain. She took a class 2 weeks ago and feels like she strained her back doing deadlifts and straight leg raises. Patient was having pain on right sided thoracic and lumbar spine. Had husband pop her back and it is feeling better. Does still have pain with workouts. Pain has improved from being constant and uncomfortable to intermittent achiness.  Has responded fairly well to osteopathic manipulation in the past      Past Medical History:  Diagnosis Date  . Gestational diabetes   . Heart murmur    during pregnancy  . Preeclampsia    Past Surgical History:  Procedure Laterality Date  . CESAREAN SECTION    . DILATION AND EVACUATION  04/23/2011   Procedure: DILATATION AND EVACUATION (D&E);  Surgeon: Floyce Stakes. Fogleman;  Location: [facility];  Service: Gynecology;  Laterality: N/A;  Ultrasound Guidance  . laser iridotomy Bilateral May 2015   Social History   Socioeconomic History  . Marital status: Married    Spouse name: Not on file  . Number of children: 1  . Years of education: Not on file  . Highest education level: Not on file  Occupational History    Employer: [employer]  . Financial resource strain: Not on file  . Food insecurity:    Worry: Not on file    Inability: Not on file  . Transportation needs:    Medical: Not on file    Non-medical: Not on file  Tobacco Use  . Smoking status: Never Smoker  . Smokeless tobacco: Never Used  Substance and Sexual Activity  . Alcohol use: [name]  . Drug use: [name]  . Sexual activity: Yes  Lifestyle  . Physical activity:    Days per week: Not on file    Minutes per session: Not on file  . Stress: Not on file  Relationships  . Social  connections:    Talks on phone: Not on file    Gets together: Not on file    Attends religious service: Not on file    Active member of club or organization: Not on file    Attends meetings of clubs or organizations: Not on file    Relationship status: Not on file  Other Topics Concern  . Not on file  Social History Narrative  . Not on file   Allergies  Allergen Reactions  . Belviq [Lorcaserin Hcl]     Crazy dreams   Family History  Problem Relation Age of Onset  . Sudden death Mother        Age 48; ? MI  . Coronary artery disease Father        Age 47  . Diabetes Father   . Hyperlipidemia Maternal Aunt   . Pulmonary fibrosis Maternal Aunt      Current Outpatient Medications (Cardiovascular):  .  nitroGLYCERIN (NITRODUR - DOSED IN MG/24 HR) 0.2 mg/hr patch, 1/4 patch daily     Current Outpatient Medications (Other):  [name]  Ascorbic Acid (VITAMIN C PO), Take by mouth. .  CRANBERRY PO, Take by mouth. .  Probiotic Product (PROBIOTIC PO), Take by mouth. .  Vitamin D, Ergocalciferol, (DRISDOL) 50000 units CAPS capsule, Take  1 capsule (50,000 Units total) by mouth every 7 (seven) days.    Past medical history, social, surgical and family history all reviewed in electronic medical record.  [name] pertanent information unless stated regarding to the chief complaint.   Review of Systems:  [name] headache, visual changes, nausea, vomiting, diarrhea, constipation, dizziness, abdominal pain, skin rash, fevers, chills, night sweats, weight loss, swollen lymph nodes, body aches, joint swelling,  chest pain, shortness of breath, mood changes.  Positive muscle aches  Objective  Blood pressure 130/86, pulse 83, height 5' 8"  (1.727 m), weight 213 lb (96.6 kg), SpO2 98 %.    General: [name] apparent distress alert and oriented x3 mood and affect normal, dressed appropriately.  HEENT: Pupils equal, extraocular movements intact  Respiratory: Patient's speak in full sentences and does not appear  short of breath  Cardiovascular: [name] lower extremity edema, non tender, [name] erythema  Skin: Warm dry intact with [name] signs of infection or rash on extremities or on axial skeleton.  Abdomen: Soft nontender  Neuro: Cranial nerves II through XII are intact, neurovascularly intact in all extremities with 2+ DTRs and 2+ pulses.  Lymph: [name] lymphadenopathy of posterior or anterior cervical chain or axillae bilaterally.  Gait normal with good balance and coordination.  MSK:  Non tender with full range of motion and good stability and symmetric strength and tone of shoulders, elbows, wrist, hip, knee and ankles bilaterally.  Back Exam:  Inspection: Unremarkable  Motion: Flexion 45 deg, Extension 45 deg, Side Bending to 45 deg bilaterally,  Rotation to 45 deg bilaterally  SLR laying: Negative  XSLR laying: Negative  Palpable tenderness: Tenderness noted.  More in the parascapular region on the right side FABER: Positive right. Sensory change: Gross sensation intact to all lumbar and sacral dermatomes.  Reflexes: 2+ at both patellar tendons, 2+ at achilles tendons, Babinski's downgoing.  Strength at foot  Plantar-flexion: 5/5 Dorsi-flexion: 5/5 Eversion: 5/5 Inversion: 5/5  Leg strength  Quad: 5/5 Hamstring: 5/5 Hip flexor: 5/5 Hip abductors: 5/5  Gait unremarkable.  Neck: Inspection mild loss of lordosis. [name] palpable stepoffs. Negative Spurling's maneuver. Full neck range of motion Grip strength and sensation normal in bilateral hands Strength good C4 to T1 distribution [name] sensory change to C4 to T1 Negative Hoffman sign bilaterally Reflexes normal  Osteopathic findings C2 flexed rotated and side bent right C6 flexed rotated and side bent left T4 extended rotated and side bent right inhaled rib T9 extended rotated and side bent left     Impression and Recommendations:     This case required medical decision making of moderate complexity. The above documentation has been reviewed  and is accurate and complete Lyndal Pulley, DO       Note: This dictation was prepared with Dragon dictation along with smaller phrase technology. Any transcriptional errors that result from this process are unintentional.

## 2018-08-07 ENCOUNTER — Encounter

## 2018-08-07 ENCOUNTER — Encounter: Payer: Self-pay | Admitting: Family Medicine

## 2018-08-07 ENCOUNTER — Ambulatory Visit (INDEPENDENT_AMBULATORY_CARE_PROVIDER_SITE_OTHER): Payer: 59 | Admitting: Family Medicine

## 2018-08-07 VITALS — BP 130/86 | HR 83 | Ht 68.0 in | Wt 213.0 lb

## 2018-08-07 DIAGNOSIS — M940 Chondrocostal junction syndrome [Tietze]: Secondary | ICD-10-CM

## 2018-08-07 DIAGNOSIS — M999 Biomechanical lesion, unspecified: Secondary | ICD-10-CM | POA: Diagnosis not present

## 2018-08-07 NOTE — Assessment & Plan Note (Signed)
Decision today to treat with OMT was based on Physical Exam  After verbal consent patient was treated with HVLA, ME, FPR techniques in cervical, thoracic, ribl areas  Patient tolerated the procedure well with improvement in symptoms  Patient given exercises, stretches and lifestyle modifications  See medications in patient instructions if given  Patient will follow up in 8 weeks

## 2018-08-07 NOTE — Assessment & Plan Note (Signed)
Stable overall.  Discussed with patient again great length.  Discussed posture and ergonomics.  Patient will continue to work on stabilizations for the scapula.  We discussed different changes in her work environment.  Follow-up with me again 6 to 8 weeks

## 2018-08-07 NOTE — Patient Instructions (Signed)
Good to see you  You are doing great  Ice 20 minutes 2 times daily. Usually after activity and before bed. Stay active See me again in 6 weeks

## 2018-08-21 ENCOUNTER — Ambulatory Visit: Payer: 59 | Admitting: Family Medicine

## 2018-10-22 ENCOUNTER — Telehealth (HOSPITAL_COMMUNITY): Payer: Self-pay | Admitting: *Deleted

## 2018-10-22 MED FILL — AMOX-CLAV 875-125 MG TABLET: 875-125 | 7 days supply | Qty: 14 | Fill #0

## 2018-10-22 NOTE — Telephone Encounter (Signed)
Pharmacy called to verify augmentin dose

## 2018-11-28 ENCOUNTER — Telehealth: Payer: Self-pay | Admitting: Physician Assistant

## 2018-11-28 DIAGNOSIS — Z0000 Encounter for general adult medical examination without abnormal findings: Secondary | ICD-10-CM

## 2018-11-28 NOTE — Telephone Encounter (Signed)
Labs ordered.

## 2018-12-07 ENCOUNTER — Encounter: Payer: 59 | Admitting: Physician Assistant

## 2019-01-04 ENCOUNTER — Ambulatory Visit (INDEPENDENT_AMBULATORY_CARE_PROVIDER_SITE_OTHER): Payer: 59 | Admitting: Physician Assistant

## 2019-01-04 ENCOUNTER — Other Ambulatory Visit: Payer: Self-pay

## 2019-01-04 VITALS — BP 124/76 | HR 90 | Temp 98.3°F | Ht 68.0 in | Wt 170.0 lb

## 2019-01-04 DIAGNOSIS — Z8632 Personal history of gestational diabetes: Secondary | ICD-10-CM

## 2019-01-04 DIAGNOSIS — Z13220 Encounter for screening for lipoid disorders: Secondary | ICD-10-CM | POA: Diagnosis not present

## 2019-01-04 DIAGNOSIS — R5383 Other fatigue: Secondary | ICD-10-CM | POA: Diagnosis not present

## 2019-01-04 DIAGNOSIS — Z0000 Encounter for general adult medical examination without abnormal findings: Secondary | ICD-10-CM

## 2019-01-04 DIAGNOSIS — R350 Frequency of micturition: Secondary | ICD-10-CM

## 2019-01-04 DIAGNOSIS — Z131 Encounter for screening for diabetes mellitus: Secondary | ICD-10-CM

## 2019-01-04 LAB — POCT URINALYSIS DIPSTICK
Bilirubin, UA: NEGATIVE
Blood, UA: NEGATIVE
Glucose, UA: NEGATIVE
Ketones, UA: 80
Leukocytes, UA: NEGATIVE
Nitrite, UA: NEGATIVE
Protein, UA: NEGATIVE
Spec Grav, UA: 1.015 (ref 1.010–1.025)
Urobilinogen, UA: 0.2 E.U./dL
pH, UA: 7 (ref 5.0–8.0)

## 2019-01-04 NOTE — Progress Notes (Signed)
Subjective:     [name] is a 43 y.o. female and is here for a comprehensive physical exam. The patient reports problems - pt has had some intermittent urinary frequency. seems to be more linked to her menstrual cycle. she also has had blood in urine before. would like this checked out today.  she has lost 40lbs since January on keto. She would like blood work. She is exercising regularly.   Social History   Socioeconomic History  . Marital status: Married    Spouse name: Not on file  . Number of children: 1  . Years of education: Not on file  . Highest education level: Not on file  Occupational History    Employer: [employer]  . Financial resource strain: Not on file  . Food insecurity:    Worry: Not on file    Inability: Not on file  . Transportation needs:    Medical: Not on file    Non-medical: Not on file  Tobacco Use  . Smoking status: Never Smoker  . Smokeless tobacco: Never Used  Substance and Sexual Activity  . Alcohol use: No  . Drug use: No  . Sexual activity: Yes  Lifestyle  . Physical activity:    Days per week: Not on file    Minutes per session: Not on file  . Stress: Not on file  Relationships  . Social connections:    Talks on phone: Not on file    Gets together: Not on file    Attends religious service: Not on file    Active member of club or organization: Not on file    Attends meetings of clubs or organizations: Not on file    Relationship status: Not on file  . Intimate partner violence:    Fear of current or ex partner: Not on file    Emotionally abused: Not on file    Physically abused: Not on file    Forced sexual activity: Not on file  Other Topics Concern  . Not on file  Social History Narrative  . Not on file   Health Maintenance  Topic Date Due  . MAMMOGRAM  02/07/2019  . INFLUENZA VACCINE  03/09/2019  . TETANUS/TDAP  08/08/2020  . PAP SMEAR-Modifier  12/23/2020  . HIV Screening  Completed    The following  portions of the patient's history were reviewed and updated as appropriate: allergies, current medications, past family history, past medical history, past social history, past surgical history and problem list.  Review of Systems Pertinent items noted in HPI and remainder of comprehensive ROS otherwise negative.   Objective:    BP 124/76   Pulse 90   Temp 98.3 F (36.8 C) (Oral)   Ht 5' 8"  (1.727 m)   Wt 170 lb (77.1 kg)   SpO2 100%   BMI 25.85 kg/m  General appearance: alert, cooperative and appears stated age Head: Normocephalic, without obvious abnormality, atraumatic Eyes: conjunctivae/corneas clear. PERRL, EOM's intact. Fundi benign. Ears: normal TM's and external ear canals both ears Nose: Nares normal. Septum midline. Mucosa normal. No drainage or sinus tenderness. Throat: lips, mucosa, and tongue normal; teeth and gums normal Neck: no adenopathy, no carotid bruit, no JVD, supple, symmetrical, trachea midline and thyroid not enlarged, symmetric, no tenderness/mass/nodules Back: symmetric, no curvature. ROM normal. No CVA tenderness. Lungs: clear to auscultation bilaterally Heart: regular rate and rhythm, S1, S2 normal, no murmur, click, rub or gallop Abdomen: soft, non-tender; bowel sounds normal; no  masses,  no organomegaly Extremities: extremities normal, atraumatic, no cyanosis or edema Pulses: 2+ and symmetric Skin: Skin color, texture, turgor normal. No rashes or lesions Lymph nodes: Cervical, supraclavicular, and axillary nodes normal. Neurologic: Mental status: Alert, oriented, thought content appropriate    Assessment:    Healthy female exam.      Plan:    Marland KitchenMarland KitchenDaphne was seen today for annual exam.  Diagnoses and all orders for this visit:  Routine physical examination -     Lipid Panel w/reflex Direct LDL -     COMPLETE METABOLIC PANEL WITH GFR -     VITAMIN D 25 Hydroxy (Vit-D Deficiency, Fractures) -     B12 and Folate Panel -     Hemoglobin A1c -      TSH -     CBC with Differential/Platelet -     Ferritin  Screening for diabetes mellitus -     Hemoglobin A1c  History of diet controlled gestational diabetes mellitus (GDM) -     Hemoglobin A1c  Low energy -     VITAMIN D 25 Hydroxy (Vit-D Deficiency, Fractures) -     B12 and Folate Panel -     TSH -     CBC with Differential/Platelet -     Ferritin  Screening for lipid disorders -     Lipid Panel w/reflex Direct LDL  Urinary frequency    Depression screen [facility] 2/9 01/04/2019 12/05/2017 11/25/2016  Decreased Interest 0 0 0  Down, Depressed, Hopeless 0 0 0  PHQ - 2 Score 0 0 0  Altered sleeping 1 0 -  Tired, decreased energy 1 1 -  Change in appetite 0 0 -  Feeling bad or failure about yourself  0 0 -  Trouble concentrating 0 0 -  Moving slowly or fidgety/restless 0 0 -  Suicidal thoughts 0 0 -  PHQ-9 Score 2 1 -  Difficult doing work/chores Not difficult at all Not difficult at all -   .. GAD 7 : Generalized Anxiety Score 01/04/2019 12/05/2017  Nervous, Anxious, on Edge 0 0  Control/stop worrying 0 0  Worry too much - different things 0 0  Trouble relaxing 0 0  Restless 0 0  Easily annoyed or irritable 0 0  Afraid - awful might happen 0 0  Total GAD 7 Score 0 0  Anxiety Difficulty Not difficult at all Not difficult at all    .[name] Discussed 150 minutes of exercise a week.  Encouraged vitamin D 1000 units and Calcium 1330m or 4 servings of dairy a day.  Fasting labs ordered today.  Mammogram and pap up to date.    Pt doing great at weight loss. Will check kidney function.   .. Results for orders placed or performed in visit on 01/04/19  POCT urinalysis dipstick  Result Value Ref Range   Color, UA yellow    Clarity, UA clear    Glucose, UA Negative Negative   Bilirubin, UA negative    Ketones, UA 80 mg/dL    Spec Grav, UA 1.015 1.010 - 1.025   Blood, UA negative    pH, UA 7.0 5.0 - 8.0   Protein, UA Negative Negative   Urobilinogen, UA 0.2 0.2 or 1.0  E.U./dL   Nitrite, UA negative    Leukocytes, UA Negative Negative   Appearance     Odor     UA looks great. No blood, glucose, protein, leuks, or nitrates. Certainly when frequency is a problem come in  for check and will do urine test. This certainly could be some bladder inflammation. Look for food/drink triggers.    See After Visit Summary for Counseling Recommendations

## 2019-01-04 NOTE — Patient Instructions (Signed)

## 2019-01-05 LAB — CBC WITH DIFFERENTIAL/PLATELET
Absolute Monocytes: 814 cells/uL (ref 200–950)
Basophils Absolute: 82 cells/uL (ref 0–200)
Basophils Relative: 0.8 %
Eosinophils Absolute: 52 cells/uL (ref 15–500)
Eosinophils Relative: 0.5 %
HCT: 45 % (ref 35.0–45.0)
Hemoglobin: 15.3 g/dL (ref 11.7–15.5)
Lymphs Abs: 1494 cells/uL (ref 850–3900)
MCH: 31.1 pg (ref 27.0–33.0)
MCHC: 34 g/dL (ref 32.0–36.0)
MCV: 91.5 fL (ref 80.0–100.0)
MPV: 11.7 fL (ref 7.5–12.5)
Monocytes Relative: 7.9 %
Neutro Abs: 7859 cells/uL — ABNORMAL HIGH (ref 1500–7800)
Neutrophils Relative %: 76.3 %
Platelets: 241 10*3/uL (ref 140–400)
RBC: 4.92 10*6/uL (ref 3.80–5.10)
RDW: 12.8 % (ref 11.0–15.0)
Total Lymphocyte: 14.5 %
WBC: 10.3 10*3/uL (ref 3.8–10.8)

## 2019-01-05 LAB — COMPLETE METABOLIC PANEL WITH GFR
AG Ratio: 2 (calc) (ref 1.0–2.5)
ALT: 13 U/L (ref 6–29)
AST: 13 U/L (ref 10–35)
Albumin: 4.6 g/dL (ref 3.6–5.1)
Alkaline phosphatase (APISO): 44 U/L (ref 31–125)
BUN: 16 mg/dL (ref 7–25)
CO2: 24 mmol/L (ref 20–32)
Calcium: 10.5 mg/dL — ABNORMAL HIGH (ref 8.6–10.2)
Chloride: 103 mmol/L (ref 98–110)
Creat: 0.82 mg/dL (ref 0.50–1.10)
GFR, Est African American: 100 mL/min/{1.73_m2} (ref >=60)
GFR, Est Non African American: 86 mL/min/{1.73_m2} (ref >=60)
Globulin: 2.3 g/dL (calc) (ref 1.9–3.7)
Glucose, Bld: 90 mg/dL (ref 65–99)
Potassium: 4.8 mmol/L (ref 3.5–5.3)
Sodium: 141 mmol/L (ref 135–146)
Total Bilirubin: 1 mg/dL (ref 0.2–1.2)
Total Protein: 6.9 g/dL (ref 6.1–8.1)

## 2019-01-05 LAB — B12 AND FOLATE PANEL
Folate: 5.5 ng/mL
Vitamin B-12: 511 pg/mL (ref 200–1100)

## 2019-01-05 LAB — LIPID PANEL W/REFLEX DIRECT LDL
Cholesterol: 179 mg/dL (ref <200)
HDL: 62 mg/dL (ref >=50)
LDL Cholesterol (Calc): 103 mg/dL (calc) — ABNORMAL HIGH
Non-HDL Cholesterol (Calc): 117 mg/dL (calc) (ref <130)
Total CHOL/HDL Ratio: 2.9 (calc) (ref <5.0)
Triglycerides: 57 mg/dL (ref <150)

## 2019-01-05 LAB — TSH: TSH: 2.04 mIU/L

## 2019-01-05 LAB — HEMOGLOBIN A1C
Hgb A1c MFr Bld: 4.7 % of total Hgb (ref <5.7)
Mean Plasma Glucose: 88 (calc)
eAG (mmol/L): 4.9 (calc)

## 2019-01-05 LAB — FERRITIN: Ferritin: 23 ng/mL (ref 16–232)

## 2019-01-05 LAB — VITAMIN D 25 HYDROXY (VIT D DEFICIENCY, FRACTURES): Vit D, 25-Hydroxy: 35 ng/mL (ref 30–100)

## 2019-01-07 ENCOUNTER — Other Ambulatory Visit: Payer: Self-pay | Admitting: Neurology

## 2019-01-07 ENCOUNTER — Encounter: Payer: Self-pay | Admitting: Physician Assistant

## 2019-01-07 DIAGNOSIS — R7989 Other specified abnormal findings of blood chemistry: Secondary | ICD-10-CM

## 2019-01-07 NOTE — Progress Notes (Signed)
Call pt: bad cholesterol only went up a hair with keto diet and good cholesterol went up by 45m Great news.  Kidney, liver, glucose look great.  b12 great.  Thyroid great.  Iron stores a little low but not anemic. Continue to eat iron rich foods and or consider multivitamin with some iron in it.  Vitamin D normal range but could be better. Make sure taking at least 1000units daily and consider increasing to 2000units daily.  neutrophils are a little elevated and WBC on the upper limit of normal. Could have been fighting off a little virus.  calicum just a tad elevated and no hx of this. Could be not enough vitamin D to pull into bones.   Maybe recheck cbc and calcium in 6 weeks to see if trending up or trended back down.

## 2019-01-07 NOTE — Telephone Encounter (Signed)
PTH added.

## 2019-01-07 NOTE — Telephone Encounter (Signed)
Please add PTH to labs ordered.

## 2019-01-09 ENCOUNTER — Other Ambulatory Visit: Payer: Self-pay | Admitting: Neurology

## 2019-01-09 DIAGNOSIS — R7989 Other specified abnormal findings of blood chemistry: Secondary | ICD-10-CM | POA: Diagnosis not present

## 2019-01-09 LAB — CBC WITH DIFFERENTIAL/PLATELET
Absolute Monocytes: 582 cells/uL (ref 200–950)
Basophils Absolute: 74 cells/uL (ref 0–200)
Basophils Relative: 0.9 %
Eosinophils Absolute: 74 cells/uL (ref 15–500)
Eosinophils Relative: 0.9 %
HCT: 42.4 % (ref 35.0–45.0)
Hemoglobin: 14.6 g/dL (ref 11.7–15.5)
Lymphs Abs: 1509 cells/uL (ref 850–3900)
MCH: 31.1 pg (ref 27.0–33.0)
MCHC: 34.4 g/dL (ref 32.0–36.0)
MCV: 90.2 fL (ref 80.0–100.0)
MPV: 11.6 fL (ref 7.5–12.5)
Monocytes Relative: 7.1 %
Neutro Abs: 5961 cells/uL (ref 1500–7800)
Neutrophils Relative %: 72.7 %
Platelets: 213 10*3/uL (ref 140–400)
RBC: 4.7 10*6/uL (ref 3.80–5.10)
RDW: 12.8 % (ref 11.0–15.0)
Total Lymphocyte: 18.4 %
WBC: 8.2 10*3/uL (ref 3.8–10.8)

## 2019-01-10 LAB — PTH, INTACT AND CALCIUM
Calcium: 10.2 mg/dL (ref 8.6–10.2)
PTH: 20 pg/mL (ref 14–64)

## 2019-01-10 LAB — CALCIUM: Calcium: 10.1 mg/dL (ref 8.6–10.2)

## 2019-01-10 NOTE — Progress Notes (Signed)
Call pt: normal parathyroid hormone. Calcium has gone down some too. Likely by adding vitamin D helped. Neutrophils decreased as well in CBC. Very reassuring labs.

## 2019-02-19 DIAGNOSIS — D2272 Melanocytic nevi of left lower limb, including hip: Secondary | ICD-10-CM | POA: Diagnosis not present

## 2019-02-19 DIAGNOSIS — L821 Other seborrheic keratosis: Secondary | ICD-10-CM | POA: Diagnosis not present

## 2019-02-19 DIAGNOSIS — L918 Other hypertrophic disorders of the skin: Secondary | ICD-10-CM | POA: Diagnosis not present

## 2019-02-19 DIAGNOSIS — D225 Melanocytic nevi of trunk: Secondary | ICD-10-CM | POA: Diagnosis not present

## 2019-02-19 DIAGNOSIS — L814 Other melanin hyperpigmentation: Secondary | ICD-10-CM | POA: Diagnosis not present

## 2019-02-19 DIAGNOSIS — D485 Neoplasm of uncertain behavior of skin: Secondary | ICD-10-CM | POA: Diagnosis not present

## 2019-02-19 DIAGNOSIS — D1801 Hemangioma of skin and subcutaneous tissue: Secondary | ICD-10-CM | POA: Diagnosis not present

## 2019-06-03 ENCOUNTER — Ambulatory Visit (INDEPENDENT_AMBULATORY_CARE_PROVIDER_SITE_OTHER): Payer: 59 | Admitting: Family Medicine

## 2019-06-03 ENCOUNTER — Other Ambulatory Visit: Payer: Self-pay

## 2019-06-03 ENCOUNTER — Encounter: Payer: Self-pay | Admitting: Family Medicine

## 2019-06-03 VITALS — BP 124/84 | HR 50 | Ht 68.0 in | Wt 154.0 lb

## 2019-06-03 DIAGNOSIS — M999 Biomechanical lesion, unspecified: Secondary | ICD-10-CM

## 2019-06-03 DIAGNOSIS — M76899 Other specified enthesopathies of unspecified lower limb, excluding foot: Secondary | ICD-10-CM | POA: Diagnosis not present

## 2019-06-03 NOTE — Assessment & Plan Note (Signed)
Patient with a more of a hamstring tendinitis.  I discussed with patient about thigh compression, changing gait, home exercises and work with Product/process development scientist, increase activity slowly.  Patient will increase activity as tolerated.  Follow-up again in 4 to 8 weeks

## 2019-06-03 NOTE — Patient Instructions (Signed)
Thigh compression sleeve Shorten stride See me again in 5 weeks

## 2019-06-03 NOTE — Assessment & Plan Note (Signed)
Decision today to treat with OMT was based on Physical Exam  After verbal consent patient was treated with HVLA, ME, FPR techniques in cervical, thoracic, rib areas  Patient tolerated the procedure well with improvement in symptoms  Patient given exercises, stretches and lifestyle modifications  See medications in patient instructions if given  Patient will follow up in 4-8 weeks

## 2019-06-03 NOTE — Progress Notes (Signed)
[name] [place], [place] [address] Phone: [phone] Subjective:    I'm seeing this patient by the request  of:    CC: Left buttocks pain follow-up  [id number]   08/07/2018 OMT  Update 06/03/2019 [name] is a 43 y.o. female coming in with complaint of left sided hip pain. Patient has been having pain in piriformis into her hamstring. Has been running 25-30 miles a week. Pain occurs with every run but improves the more she moves. Pain will come back after she stops running and stretching.     Past Medical History:  Diagnosis Date  . Gestational diabetes   . Heart murmur    during pregnancy  . Preeclampsia    Past Surgical History:  Procedure Laterality Date  . CESAREAN SECTION    . DILATION AND EVACUATION  04/23/2011   Procedure: DILATATION AND EVACUATION (D&E);  Surgeon: Floyce Stakes. Fogleman;  Location: [facility];  Service: Gynecology;  Laterality: N/A;  Ultrasound Guidance  . laser iridotomy Bilateral May 2015   Social History   Socioeconomic History  . Marital status: Married    Spouse name: Not on file  . Number of children: 1  . Years of education: Not on file  . Highest education level: Not on file  Occupational History    Employer: [employer]  . Financial resource strain: Not on file  . Food insecurity    Worry: Not on file    Inability: Not on file  . Transportation needs    Medical: Not on file    Non-medical: Not on file  Tobacco Use  . Smoking status: Never Smoker  . Smokeless tobacco: Never Used  Substance and Sexual Activity  . Alcohol use: No  . Drug use: No  . Sexual activity: Yes  Lifestyle  . Physical activity    Days per week: Not on file    Minutes per session: Not on file  . Stress: Not on file  Relationships  . Social Herbalist on phone: Not on file    Gets together: Not on file    Attends religious service: Not on file    Active member of club or  organization: Not on file    Attends meetings of clubs or organizations: Not on file    Relationship status: Not on file  Other Topics Concern  . Not on file  Social History Narrative  . Not on file   Allergies  Allergen Reactions  . Belviq [Lorcaserin Hcl]     Crazy dreams   Family History  Problem Relation Age of Onset  . Sudden death Mother        Age 64; ? MI  . Coronary artery disease Father        Age 29  . Diabetes Father   . Hyperlipidemia Maternal Aunt   . Pulmonary fibrosis Maternal Aunt          Current Outpatient Medications (Other):  .  cholecalciferol (VITAMIN D3) 25 MCG (1000 UT) tablet, Take 1,000 Units by mouth daily. [name]  CRANBERRY PO, Take by mouth. .  Probiotic Product (PROBIOTIC PO), Take by mouth.    Past medical history, social, surgical and family history all reviewed in electronic medical record.  No pertanent information unless stated regarding to the chief complaint.   Review of Systems:  No headache, visual changes, nausea, vomiting, diarrhea, constipation, dizziness, abdominal pain, skin rash, fevers, chills,  night sweats, weight loss, swollen lymph nodes, body aches, joint swelling,  chest pain, shortness of breath, mood changes.  Mild positive muscle aches  Objective  Blood pressure 124/84, pulse (!) 50, height 5' 8"  (1.727 m), weight 154 lb (69.9 kg), SpO2 99 %.    General: No apparent distress alert and oriented x3 mood and affect normal, dressed appropriately.  HEENT: Pupils equal, extraocular movements intact  Respiratory: Patient's speak in full sentences and does not appear short of breath  Cardiovascular: No lower extremity edema, non tender, no erythema  Skin: Warm dry intact with no signs of infection or rash on extremities or on axial skeleton.  Abdomen: Soft nontender  Neuro: Cranial nerves II through XII are intact, neurovascularly intact in all extremities with 2+ DTRs and 2+ pulses.  Lymph: No lymphadenopathy of posterior  or anterior cervical chain or axillae bilaterally.  Gait normal with good balance and coordination.  MSK:  Non tender with full range of motion and good stability and symmetric strength and tone of shoulders, elbows, wrist, , knee and ankles bilaterally.  Left hip exam fairly unremarkable except pain over the ischial bursa area.  Significant tightness with straight leg test.  No true radicular symptoms.  Minimal back pain with it.  Patient's upper back though still has some pain in the parascapular region right greater than left.  Osteopathic findings C2 flexed rotated and side bent right C4 flexed rotated and side bent left left T3 extended rotated and side bent right inhaled third rib T5 extended rotated and side bent left    Impression and Recommendations:     This case required medical decision making of moderate complexity. The above documentation has been reviewed and is accurate and complete Lyndal Pulley, DO       Note: This dictation was prepared with Dragon dictation along with smaller phrase technology. Any transcriptional errors that result from this process are unintentional.

## 2019-06-05 ENCOUNTER — Other Ambulatory Visit: Payer: Self-pay

## 2019-06-05 DIAGNOSIS — Z20822 Contact with and (suspected) exposure to covid-19: Secondary | ICD-10-CM

## 2019-06-07 LAB — NOVEL CORONAVIRUS, NAA: SARS-CoV-2, NAA: NOT DETECTED

## 2019-07-09 ENCOUNTER — Ambulatory Visit (INDEPENDENT_AMBULATORY_CARE_PROVIDER_SITE_OTHER): Payer: 59 | Admitting: Family Medicine

## 2019-07-09 ENCOUNTER — Encounter: Payer: Self-pay | Admitting: Family Medicine

## 2019-07-09 ENCOUNTER — Other Ambulatory Visit: Payer: Self-pay | Admitting: Obstetrics

## 2019-07-09 ENCOUNTER — Other Ambulatory Visit: Payer: Self-pay

## 2019-07-09 VITALS — BP 112/70 | HR 65 | Ht 68.0 in | Wt 157.0 lb

## 2019-07-09 DIAGNOSIS — M940 Chondrocostal junction syndrome [Tietze]: Secondary | ICD-10-CM | POA: Diagnosis not present

## 2019-07-09 DIAGNOSIS — M999 Biomechanical lesion, unspecified: Secondary | ICD-10-CM

## 2019-07-09 DIAGNOSIS — Z1231 Encounter for screening mammogram for malignant neoplasm of breast: Secondary | ICD-10-CM

## 2019-07-09 NOTE — Patient Instructions (Signed)
  Eucerin lotion-thickest that you can find Tinactin every other day If not better, call me on Monday and can call in doxy See me in 6-7 weeks    [address]st floor [place], [place] [address] Phone [phone]

## 2019-07-09 NOTE — Assessment & Plan Note (Signed)
Decision today to treat with OMT was based on Physical Exam  After verbal consent patient was treated with HVLA, ME, FPR techniques in cervical, thoracic, rib lumbar and sacral areas  Patient tolerated the procedure well with improvement in symptoms  Patient given exercises, stretches and lifestyle modifications  See medications in patient instructions if given  Patient will follow up in 4-8 weeks

## 2019-07-09 NOTE — Progress Notes (Signed)
[name], [place] [address] Phone: [phone] Subjective:   [name], am serving as a scribe for Dr. Hulan [name].  This visit occurred during the SARS-CoV-2 public health emergency.  Safety protocols were in place, including screening questions prior to the visit, additional usage of staff PPE, and extensive cleaning of exam room while observing appropriate contact time as indicated for disinfecting solutions.    CC: Low back pain follow-up  [id number]:[id number]  [name] is a 43 y.o. female coming in with complaint of back pain. Is here for OMT today. Last seen on 06/03/2019. Left hamstring is improving. Has been icing, doing exercises and trying to run. Pain is better but still present over the proximal hamstring tendon.   Also mentions that she has an itchy, rash below her bra line that developed on Friday. Wears a running belt and feels that it may have come from the belt. Has been self treating with hydrocortisone cream.    Past Medical History:  Diagnosis Date  . Gestational diabetes   . Heart murmur    during pregnancy  . Preeclampsia    Past Surgical History:  Procedure Laterality Date  . CESAREAN SECTION    . DILATION AND EVACUATION  04/23/2011   Procedure: DILATATION AND EVACUATION (D&E);  Surgeon: Floyce Stakes. Fogleman;  Location: [facility];  Service: Gynecology;  Laterality: N/A;  Ultrasound Guidance  . laser iridotomy Bilateral May 2015   Social History   Socioeconomic History  . Marital status: Married    Spouse name: Not on file  . Number of children: 1  . Years of education: Not on file  . Highest education level: Not on file  Occupational History    Employer: [employer]  . Financial resource strain: Not on file  . Food insecurity    Worry: Not on file    Inability: Not on file  . Transportation needs    Medical: Not on file    Non-medical: Not on file  Tobacco Use  . Smoking  status: Never Smoker  . Smokeless tobacco: Never Used  Substance and Sexual Activity  . Alcohol use: [name]  . Drug use: [name]  . Sexual activity: Yes  Lifestyle  . Physical activity    Days per week: Not on file    Minutes per session: Not on file  . Stress: Not on file  Relationships  . Social Herbalist on phone: Not on file    Gets together: Not on file    Attends religious service: Not on file    Active member of club or organization: Not on file    Attends meetings of clubs or organizations: Not on file    Relationship status: Not on file  Other Topics Concern  . Not on file  Social History Narrative  . Not on file   Allergies  Allergen Reactions  . Belviq [Lorcaserin Hcl]     Crazy dreams   Family History  Problem Relation Age of Onset  . Sudden death Mother        Age 31; ? MI  . Coronary artery disease Father        Age 48  . Diabetes Father   . Hyperlipidemia Maternal Aunt   . Pulmonary fibrosis Maternal Aunt          Current Outpatient Medications (Other):  .  cholecalciferol (VITAMIN D3) 25 MCG (1000 UT) tablet,  Take 1,000 Units by mouth daily. [name]  CRANBERRY PO, Take by mouth. .  Probiotic Product (PROBIOTIC PO), Take by mouth.    Past medical history, social, surgical and family history all reviewed in electronic medical record.  [name] pertanent information unless stated regarding to the chief complaint.   Review of Systems:  [name] headache, visual changes, nausea, vomiting, diarrhea, constipation, dizziness, abdominal pain, skin rash, fevers, chills, night sweats, weight loss, swollen lymph nodes, body aches, joint swelling, muscle aches, chest pain, shortness of breath, mood changes.   Objective  Blood pressure 112/70, pulse 65, height 5' 8"  (1.727 m), weight 157 lb (71.2 kg), SpO2 98 %.    General: [name] apparent distress alert and oriented x3 mood and affect normal, dressed appropriately.  HEENT: Pupils equal, extraocular movements intact   Respiratory: Patient's speak in full sentences and does not appear short of breath  Cardiovascular: [name] lower extremity edema, non tender, [name] erythema  Skin: Warm dry intact with [name] signs of infection or rash on extremities or on axial skeleton.  Abdomen: Soft nontender  Neuro: Cranial nerves II through XII are intact, neurovascularly intact in all extremities with 2+ DTRs and 2+ pulses.  Lymph: [name] lymphadenopathy of posterior or anterior cervical chain or axillae bilaterally.  Gait normal with good balance and coordination.  MSK:  Non tender with full range of motion and good stability and symmetric strength and tone of shoulders, elbows, wrist, hip, knee and ankles bilaterally.  Back Exam:  Inspection: Unremarkable  Motion: Flexion 40 deg, Extension 35 deg, Side Bending to 35 deg bilaterally,  Rotation to 45 deg bilaterally  SLR laying: Negative  XSLR laying: Negative  Palpable tenderness: Tender to palpation of the paraspinal musculature lumbar spine regarding left. FABER: Positive Faber. Sensory change: Gross sensation intact to all lumbar and sacral dermatomes.  Reflexes: 2+ at both patellar tendons, 2+ at achilles tendons, Babinski's downgoing.  Strength at foot  Plantar-flexion: 5/5 Dorsi-flexion: 5/5 Eversion: 5/5 Inversion: 5/5  Leg strength  Quad: 5/5 Hamstring: 5/5 Hip flexor: 5/5 Hip abductors: 5/5    Osteopathic findings C2 flexed rotated and side bent right C5 flexed rotated and side bent left T3 extended rotated and side bent right inhaled third rib T7 extended rotated and side bent left L2 flexed rotated and side bent right Sacrum right on right    Impression and Recommendations:     This case required medical decision making of moderate complexity. The above documentation has been reviewed and is accurate and complete Julie Pulley, DO       Note: This dictation was prepared with Dragon dictation along with smaller phrase technology. Any transcriptional  errors that result from this process are unintentional.

## 2019-07-09 NOTE — Assessment & Plan Note (Signed)
Continues to have more of a tendinitis of the lower back.  We discussed with patient icing regimen and home exercise, discussed which activities to do which wants to avoid.  Patient will increase activity slowly.  Follow-up again in 4 to 8 weeks

## 2019-07-12 ENCOUNTER — Other Ambulatory Visit: Payer: Self-pay

## 2019-07-12 ENCOUNTER — Ambulatory Visit
Admission: RE | Admit: 2019-07-12 | Discharge: 2019-07-12 | Disposition: A | Discharge status: Home / Self Care | Payer: 59 | Source: Ambulatory Visit | Attending: Obstetrics | Admitting: Obstetrics

## 2019-07-12 DIAGNOSIS — Z1231 Encounter for screening mammogram for malignant neoplasm of breast: Secondary | ICD-10-CM

## 2019-07-17 ENCOUNTER — Other Ambulatory Visit: Payer: Self-pay | Admitting: Infectious Diseases

## 2019-07-17 MED ORDER — TRIAMCINOLONE ACETONIDE 0.5 % EX OINT: 1.0000 "application " | TOPICAL_OINTMENT | Freq: Two times a day (BID) | CUTANEOUS | 0 refills | Status: DC

## 2019-07-17 MED ORDER — HYDROXYZINE HCL 10 MG PO TABS: 10.0000 mg | ORAL_TABLET | Freq: Three times a day (TID) | ORAL | 0 refills | Status: DC | PRN

## 2019-07-17 MED FILL — TRIAMCINOLONE 0.5% OINTMENT: 0.5 | 20 days supply | Qty: 30 | Fill #0

## 2019-07-17 MED FILL — hydrOXYzine HCL 10 MG TABS: 10 | 10 days supply | Qty: 30 | Fill #0

## 2019-08-06 DIAGNOSIS — Z01419 Encounter for gynecological examination (general) (routine) without abnormal findings: Secondary | ICD-10-CM | POA: Diagnosis not present

## 2019-08-06 DIAGNOSIS — Z6824 Body mass index (BMI) 24.0-24.9, adult: Secondary | ICD-10-CM | POA: Diagnosis not present

## 2019-08-19 ENCOUNTER — Encounter: Payer: Self-pay | Admitting: Family Medicine

## 2019-08-20 ENCOUNTER — Encounter: Payer: Self-pay | Admitting: Family Medicine

## 2019-08-20 ENCOUNTER — Other Ambulatory Visit: Payer: Self-pay

## 2019-08-20 ENCOUNTER — Ambulatory Visit (INDEPENDENT_AMBULATORY_CARE_PROVIDER_SITE_OTHER): Payer: 59

## 2019-08-20 ENCOUNTER — Ambulatory Visit (INDEPENDENT_AMBULATORY_CARE_PROVIDER_SITE_OTHER): Payer: 59 | Admitting: Family Medicine

## 2019-08-20 VITALS — BP 124/86 | HR 64 | Ht 68.0 in | Wt 160.0 lb

## 2019-08-20 DIAGNOSIS — M79605 Pain in left leg: Secondary | ICD-10-CM

## 2019-08-20 DIAGNOSIS — S76312A Strain of muscle, fascia and tendon of the posterior muscle group at thigh level, left thigh, initial encounter: Secondary | ICD-10-CM

## 2019-08-20 MED ORDER — MELOXICAM 15 MG PO TABS: 15.0000 mg | ORAL_TABLET | Freq: Every day | ORAL | 0 refills | Status: DC

## 2019-08-20 MED ORDER — NITROGLYCERIN 0.1 MG/HR TD PT24: MEDICATED_PATCH | TRANSDERMAL | 12 refills | Status: DC

## 2019-08-20 MED ORDER — VITAMIN D (ERGOCALCIFEROL) 1.25 MG (50000 UNIT) PO CAPS: 50000.0000 [IU] | ORAL_CAPSULE | ORAL | 0 refills | Status: DC

## 2019-08-20 MED FILL — NITROGLYCERIN 0.1 MG/HR PTC: 0.1 | 80 days supply | Qty: 20 | Fill #0

## 2019-08-20 MED FILL — VIT D2 1.25 MG (50,000 UNIT: 1.25 MG | 84 days supply | Qty: 12 | Fill #0

## 2019-08-20 MED FILL — MELOXICAM 15 MG TABLET: 15 | 30 days supply | Qty: 30 | Fill #0

## 2019-08-20 MED FILL — VIT D2 1.25 MG (50,000 UNIT: 1.25 MG | 84 days supply | Qty: 12 | Fill #0 | Status: TO

## 2019-08-20 MED FILL — NITROGLYCERIN 0.1 MG/HR PTC: 0.1 | 80 days supply | Qty: 20 | Fill #0 | Status: TO

## 2019-08-20 NOTE — Assessment & Plan Note (Signed)
Tear noted, once weekly vitamin D secondary to the cortical irregularity, nitroglycerin patches and warned of potential side effects.  Patient has had this previously.  Discussed the possibility of injection which would make patient though to decrease her activity which she does not want to do with her being motivated and continuing with her weight loss.  Meloxicam given for short duration.  Follow-up with me again in 4 weeks to further evaluate

## 2019-08-20 NOTE — Progress Notes (Signed)
[name] [address] [name] [name] Phone: [phone] Subjective:   I [name] am serving as a Education administrator for Dr. Hulan Saas.  This visit occurred during the SARS-CoV-2 public health emergency.  Safety protocols were in place, including screening questions prior to the visit, additional usage of staff PPE, and extensive cleaning of exam room while observing appropriate contact time as indicated for disinfecting solutions.   I'm seeing this patient by the request  of:    CC: Left hamstring pain  HDQ:[id number]   07/09/2019 OMT  [name] is a 44 y.o. female coming in with complaint of left hamstring pain. Patient states she is in pain. States it is an old hamstring injury. States she accidentally turned her treadmill up before getting back on it. States she felt it tear.  Onset- Friday  Location - Proximal hamstring Duration-  Character- Aggravating factors- sitting, walking  Reliving factors-  Therapies tried- Ice, Ibuprofen  Severity-  6-7/10 at its worse      Past Medical History:  Diagnosis Date  . Gestational diabetes   . Heart murmur    during pregnancy  . Preeclampsia    Past Surgical History:  Procedure Laterality Date  . CESAREAN SECTION    . DILATION AND EVACUATION  04/23/2011   Procedure: DILATATION AND EVACUATION (D&E);  Surgeon: Floyce Stakes. Fogleman;  Location: [facility];  Service: Gynecology;  Laterality: N/A;  Ultrasound Guidance  . laser iridotomy Bilateral May 2015   Social History   Socioeconomic History  . Marital status: Married    Spouse name: Not on file  . Number of children: 1  . Years of education: Not on file  . Highest education level: Not on file  Occupational History    Employer: [employer]  Tobacco Use  . Smoking status: Never Smoker  . Smokeless tobacco: Never Used  Substance and Sexual Activity  . Alcohol use: No  . Drug use: No  . Sexual activity: Yes  Other Topics Concern  .  Not on file  Social History Narrative  . Not on file   Social Determinants of Health   Financial Resource Strain:   . Difficulty of Paying Living Expenses: Not on file  Food Insecurity:   . Worried About Charity fundraiser in the Last Year: Not on file  . Ran Out of Food in the Last Year: Not on file  Transportation Needs:   . Lack of Transportation (Medical): Not on file  . Lack of Transportation (Non-Medical): Not on file  Physical Activity:   . Days of Exercise per Week: Not on file  . Minutes of Exercise per Session: Not on file  Stress:   . Feeling of Stress : Not on file  Social Connections:   . Frequency of Communication with Friends and Family: Not on file  . Frequency of Social Gatherings with Friends and Family: Not on file  . Attends Religious Services: Not on file  . Active Member of Clubs or Organizations: Not on file  . Attends Archivist Meetings: Not on file  . Marital Status: Not on file   Allergies  Allergen Reactions  . Belviq [Lorcaserin Hcl]     Crazy dreams   Family History  Problem Relation Age of Onset  . Sudden death Mother        Age 78; ? MI  . Coronary artery disease Father        Age 48  .  Diabetes Father   . Hyperlipidemia Maternal Aunt   . Pulmonary fibrosis Maternal Aunt      Current Outpatient Medications (Cardiovascular):  .  nitroGLYCERIN (NITRO-DUR) 0.1 mg/hr patch, 1/4 patch daily     Current Outpatient Medications (Other):  .  cholecalciferol (VITAMIN D3) 25 MCG (1000 UT) tablet, Take 1,000 Units by mouth daily. [name]  CRANBERRY PO, Take by mouth. .  hydrOXYzine (ATARAX/VISTARIL) 10 MG tablet, Take 1 tablet (10 mg total) by mouth 3 (three) times daily as needed. .  Probiotic Product (PROBIOTIC PO), Take by mouth. .  triamcinolone ointment (KENALOG) 0.5 %, Apply 1 application topically 2 (two) times daily. .  Vitamin D, Ergocalciferol, (DRISDOL) 1.25 MG (50000 UNIT) CAPS capsule, Take 1 capsule (50,000 Units total) by  mouth every 7 (seven) days.    Past medical history, social, surgical and family history all reviewed in electronic medical record.  No pertanent information unless stated regarding to the chief complaint.   Review of Systems:  No headache, visual changes, nausea, vomiting, diarrhea, constipation, dizziness, abdominal pain, skin rash, fevers, chills, night sweats, weight loss, swollen lymph nodes, body aches, joint swelling, muscle aches, chest pain, shortness of breath, mood changes.   Objective  Blood pressure 124/86, pulse 64, height 5' 8"  (1.727 m), weight 160 lb (72.6 kg), SpO2 98 %. Systems examined below as of    General: No apparent distress alert and oriented x3 mood and affect normal, dressed appropriately.  HEENT: Pupils equal, extraocular movements intact  Respiratory: Patient's speak in full sentences and does not appear short of breath  Cardiovascular: No lower extremity edema, non tender, no erythema  Skin: Warm dry intact with no signs of infection or rash on extremities or on axial skeleton.  Abdomen: Soft nontender  Neuro: Cranial nerves II through XII are intact, neurovascularly intact in all extremities with 2+ DTRs and 2+ pulses.  Lymph: No lymphadenopathy of posterior or anterior cervical chain or axillae bilaterally.  Gait mild antalgic MSK: Left hamstring does have tenderness to palpation over the ischial area severely.  Possible mild swelling in the area.  No gap appreciated though.  Patient does have pain with resisted flexion of the knee.  Seems to be more medial than lateral.  On inspection no bruising noted.  Limited musculoskeletal ultrasound was performed and interpreted by Lyndal Pulley  Limited ultrasound shows that patient ischial area does have some mild cortical irregularity that appears to be acute on chronic with mild hypoechoic changes and increasing Doppler flow.  Questionable deep fibers of the hamstring tendon may have new tear noted but no  significant retraction noted. 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 The above documentation has been reviewed and is accurate and complete Lyndal Pulley, DO       Note: This dictation was prepared with Dragon dictation along with smaller phrase technology. Any transcriptional errors that result from this process are unintentional.

## 2019-08-20 NOTE — Patient Instructions (Addendum)
Once weekly vitamin D Start exercises in a week Nitroglycerin Protocol   Apply 1/4 nitroglycerin patch to affected area daily.  Change position of patch within the affected area every 24 hours.  You may experience a headache during the first 1-2 weeks of using the patch, these should subside.  If you experience headaches after beginning nitroglycerin patch treatment, you may take your preferred over the counter pain reliever.  Another side effect of the nitroglycerin patch is skin irritation or rash related to patch adhesive.  Please notify our office if you develop more severe headaches or rash, and stop the patch.  Tendon healing with nitroglycerin patch may require 12 to 24 weeks depending on the extent of injury.  Men should not use if taking Viagra, Cialis, or Levitra.   Do not use if you have migraines or rosacea. See me again 4-5 weeks

## 2019-08-30 DIAGNOSIS — B373 Candidiasis of vulva and vagina: Secondary | ICD-10-CM | POA: Diagnosis not present

## 2019-08-30 DIAGNOSIS — R300 Dysuria: Secondary | ICD-10-CM | POA: Diagnosis not present

## 2019-08-30 MED FILL — FLUCONAZOLE 150 MG TABS: 150 | 7 days supply | Qty: 3 | Fill #0

## 2019-08-30 MED FILL — NYSTATIN-TRIAMCINOLONE OINT: 100000-0.1 | 10 days supply | Qty: 45 | Fill #0

## 2019-11-27 MED FILL — NYSTATIN-TRIAMCINOLONE OINT: 100000-0.1 | 14 days supply | Qty: 45 | Fill #0

## 2019-11-27 MED FILL — FLUCONAZOLE 150 MG TABLET: 150 | 7 days supply | Qty: 3 | Fill #0

## 2019-12-10 DIAGNOSIS — L292 Pruritus vulvae: Secondary | ICD-10-CM | POA: Diagnosis not present

## 2019-12-10 MED FILL — NYSTATIN-TRIAMCINOLONE OINT: 100000-0.1 | 10 days supply | Qty: 45 | Fill #0

## 2020-01-07 ENCOUNTER — Encounter: Payer: Self-pay | Admitting: Physician Assistant

## 2020-01-07 ENCOUNTER — Ambulatory Visit (INDEPENDENT_AMBULATORY_CARE_PROVIDER_SITE_OTHER): Payer: 59 | Admitting: Physician Assistant

## 2020-01-07 VITALS — BP 117/66 | HR 63 | Ht 68.0 in | Wt 159.0 lb

## 2020-01-07 DIAGNOSIS — Z1211 Encounter for screening for malignant neoplasm of colon: Secondary | ICD-10-CM | POA: Diagnosis not present

## 2020-01-07 DIAGNOSIS — H5202 Hypermetropia, left eye: Secondary | ICD-10-CM | POA: Diagnosis not present

## 2020-01-07 DIAGNOSIS — J029 Acute pharyngitis, unspecified: Secondary | ICD-10-CM | POA: Insufficient documentation

## 2020-01-07 DIAGNOSIS — Z131 Encounter for screening for diabetes mellitus: Secondary | ICD-10-CM | POA: Diagnosis not present

## 2020-01-07 DIAGNOSIS — Z13220 Encounter for screening for lipoid disorders: Secondary | ICD-10-CM | POA: Diagnosis not present

## 2020-01-07 DIAGNOSIS — Z0000 Encounter for general adult medical examination without abnormal findings: Secondary | ICD-10-CM

## 2020-01-07 NOTE — Progress Notes (Signed)
Subjective:     [name] is a 44 y.o. female and is here for a comprehensive physical exam. The patient reports no problems.  She has had a sore throat and swollen glands for the last few days. No fever, chills, nausea, vomiting, diarrhea, cough. Not tried anything to make better.   Social History   Socioeconomic History   Marital status: Married    Spouse name: Not on file   Number of children: 1   Years of education: Not on file   Highest education level: Not on file  Occupational History    Employer: [employer]  Tobacco Use   Smoking status: Never Smoker   Smokeless tobacco: Never Used  Substance and Sexual Activity   Alcohol use: No   Drug use: No   Sexual activity: Yes  Other Topics Concern   Not on file  Social History Narrative   Not on file   Social Determinants of Health   Financial Resource Strain:    Difficulty of Paying Living Expenses:   Food Insecurity:    Worried About Charity fundraiser in the Last Year:    Arboriculturist in the Last Year:   Transportation Needs:    Film/video editor (Medical):    Lack of Transportation (Non-Medical):   Physical Activity:    Days of Exercise per Week:    Minutes of Exercise per Session:   Stress:    Feeling of Stress :   Social Connections:    Frequency of Communication with Friends and Family:    Frequency of Social Gatherings with Friends and Family:    Attends Religious Services:    Active Member of Clubs or Organizations:    Attends Music therapist:    Marital Status:   Intimate Partner Violence:    Fear of Current or Ex-Partner:    Emotionally Abused:    Physically Abused:    Sexually Abused:    Health Maintenance  Topic Date Due   INFLUENZA VACCINE  03/08/2020   MAMMOGRAM  07/11/2020   TETANUS/TDAP  08/08/2020   PAP SMEAR-Modifier  12/23/2020   COVID-19 Vaccine  Completed   HIV Screening  Completed    The following portions of the  patient's history were reviewed and updated as appropriate: allergies, current medications, past family history, past medical history, past social history, past surgical history and problem list.  Review of Systems Pertinent items noted in HPI and remainder of comprehensive ROS otherwise negative.   Objective:    BP 117/66    Pulse 63    Ht 5' 8"  (1.727 m)    Wt 159 lb (72.1 kg)    SpO2 97%    BMI 24.18 kg/m  General appearance: alert, cooperative and appears stated age Head: Normocephalic, without obvious abnormality, atraumatic Eyes: conjunctivae/corneas clear. PERRL, EOM's intact. Fundi benign. Ears: normal TM's and external ear canals both ears Nose: Nares normal. Septum midline. Mucosa normal. No drainage or sinus tenderness. Throat: lips, mucosa, and tongue normal; teeth and gums normal Neck: no adenopathy, no carotid bruit, no JVD, supple, symmetrical, trachea midline and thyroid not enlarged, symmetric, no tenderness/mass/nodules Back: symmetric, no curvature. ROM normal. No CVA tenderness. Lungs: clear to auscultation bilaterally Heart: regular rate and rhythm, S1, S2 normal, no murmur, click, rub or gallop Abdomen: soft, non-tender; bowel sounds normal; no masses,  no organomegaly Extremities: extremities normal, atraumatic, no cyanosis or edema Pulses: 2+ and symmetric Skin: Skin color, texture, turgor normal. No  rashes or lesions Lymph nodes: Cervical, supraclavicular, and axillary nodes normal. Neurologic: Alert and oriented X 3, normal strength and tone. Normal symmetric reflexes. Normal coordination and gait    Assessment:    Healthy female exam.      Plan:   Julie KitchenMarland KitchenDaphne was seen today for annual exam.  Diagnoses and all orders for this visit:  Routine physical examination -     Lipid Panel w/reflex Direct LDL -     COMPLETE METABOLIC PANEL WITH GFR -     Ambulatory referral to Gastroenterology -     Hemoglobin A1c  Colon cancer screening -     Ambulatory  referral to Gastroenterology  Screening for diabetes mellitus -     COMPLETE METABOLIC PANEL WITH GFR -     Hemoglobin A1c  Screening for lipid disorders -     Lipid Panel w/reflex Direct LDL   .[name] Depression screen [facility] 2/9 01/07/2020 01/04/2019 12/05/2017 11/25/2016  Decreased Interest 0 0 0 0  Down, Depressed, Hopeless 0 0 0 0  PHQ - 2 Score 0 0 0 0  Altered sleeping 1 1 0 -  Tired, decreased energy 0 1 1 -  Change in appetite 0 0 0 -  Feeling bad or failure about yourself  0 0 0 -  Trouble concentrating 0 0 0 -  Moving slowly or fidgety/restless 0 0 0 -  Suicidal thoughts 0 0 0 -  PHQ-9 Score 1 2 1  -  Difficult doing work/chores Not difficult at all Not difficult at all Not difficult at all -   .. Discussed 150 minutes of exercise a week.  Encouraged vitamin D 1000 units and Calcium 1372m or 4 servings of dairy a day.  Mammogram UTD. Pap UTD. Will get copy to abstract.  Ordered colonoscopy based on 46yo guidelines.  Vaccines UTD.  Fasting labs ordered.    ST-negative strep test in house. Discussed symptomatic care. If not improving call office.     See After Visit Summary for Counseling Recommendations

## 2020-01-07 NOTE — Patient Instructions (Signed)
Health Maintenance, Female Adopting a healthy lifestyle and getting preventive care are important in promoting health and wellness. Ask your health care provider about:  The right schedule for you to have regular tests and exams.  Things you can do on your own to prevent diseases and keep yourself healthy. What should I know about diet, weight, and exercise? Eat a healthy diet   Eat a diet that includes plenty of vegetables, fruits, low-fat dairy products, and lean protein.  Do not eat a lot of foods that are high in solid fats, added sugars, or sodium. Maintain a healthy weight Body mass index (BMI) is used to identify weight problems. It estimates body fat based on height and weight. Your health care provider can help determine your BMI and help you achieve or maintain a healthy weight. Get regular exercise Get regular exercise. This is one of the most important things you can do for your health. Most adults should:  Exercise for at least 150 minutes each week. The exercise should increase your heart rate and make you sweat (moderate-intensity exercise).  Do strengthening exercises at least twice a week. This is in addition to the moderate-intensity exercise.  Spend less time sitting. Even light physical activity can be beneficial. Watch cholesterol and blood lipids Have your blood tested for lipids and cholesterol at 46 years of age, then have this test every 5 years. Have your cholesterol levels checked more often if:  Your lipid or cholesterol levels are high.  You are older than 46 years of age.  You are at high risk for heart disease. What should I know about cancer screening? Depending on your health history and family history, you may need to have cancer screening at various ages. This may include screening for:  Breast cancer.  Cervical cancer.  Colorectal cancer.  Skin cancer.  Lung cancer. What should I know about heart disease, diabetes, and high blood  pressure? Blood pressure and heart disease  High blood pressure causes heart disease and increases the risk of stroke. This is more likely to develop in people who have high blood pressure readings, are of African descent, or are overweight.  Have your blood pressure checked: ? Every 3-5 years if you are 18-39 years of age. ? Every year if you are 40 years old or older. Diabetes Have regular diabetes screenings. This checks your fasting blood sugar level. Have the screening done:  Once every three years after age 40 if you are at a normal weight and have a low risk for diabetes.  More often and at a younger age if you are overweight or have a high risk for diabetes. What should I know about preventing infection? Hepatitis B If you have a higher risk for hepatitis B, you should be screened for this virus. Talk with your health care provider to find out if you are at risk for hepatitis B infection. Hepatitis C Testing is recommended for:  Everyone born from 1945 through 1965.  Anyone with known risk factors for hepatitis C. Sexually transmitted infections (STIs)  Get screened for STIs, including gonorrhea and chlamydia, if: ? You are sexually active and are younger than 46 years of age. ? You are older than 46 years of age and your health care provider tells you that you are at risk for this type of infection. ? Your sexual activity has changed since you were last screened, and you are at increased risk for chlamydia or gonorrhea. Ask your health care provider if   you are at risk.  Ask your health care provider about whether you are at high risk for HIV. Your health care provider may recommend a prescription medicine to help prevent HIV infection. If you choose to take medicine to prevent HIV, you should first get tested for HIV. You should then be tested every 3 months for as long as you are taking the medicine. Pregnancy  If you are about to stop having your period (premenopausal) and  you may become pregnant, seek counseling before you get pregnant.  Take 400 to 800 micrograms (mcg) of folic acid every day if you become pregnant.  Ask for birth control (contraception) if you want to prevent pregnancy. Osteoporosis and menopause Osteoporosis is a disease in which the bones lose minerals and strength with aging. This can result in bone fractures. If you are 65 years old or older, or if you are at risk for osteoporosis and fractures, ask your health care provider if you should:  Be screened for bone loss.  Take a calcium or vitamin D supplement to lower your risk of fractures.  Be given hormone replacement therapy (HRT) to treat symptoms of menopause. Follow these instructions at home: Lifestyle  Do not use any products that contain nicotine or tobacco, such as cigarettes, e-cigarettes, and chewing tobacco. If you need help quitting, ask your health care provider.  Do not use street drugs.  Do not share needles.  Ask your health care provider for help if you need support or information about quitting drugs. Alcohol use  Do not drink alcohol if: ? Your health care provider tells you not to drink. ? You are pregnant, may be pregnant, or are planning to become pregnant.  If you drink alcohol: ? Limit how much you use to 0-1 drink a day. ? Limit intake if you are breastfeeding.  Be aware of how much alcohol is in your drink. In the [place] one drink equals one 12 oz bottle of beer (355 mL), one 5 oz glass of wine (148 mL), or one 1 oz glass of hard liquor (44 mL). General instructions  Schedule regular health, dental, and eye exams.  Stay current with your vaccines.  Tell your health care provider if: ? You often feel depressed. ? You have ever been abused or do not feel safe at home. Summary  Adopting a healthy lifestyle and getting preventive care are important in promoting health and wellness.  Follow your health care provider's instructions about healthy  diet, exercising, and getting tested or screened for diseases.  Follow your health care provider's instructions on monitoring your cholesterol and blood pressure. This information is not intended to replace advice given to you by your health care provider. Make sure you discuss any questions you have with your health care provider. Document Revised: 07/18/2018 Document Reviewed: 07/18/2018 Elsevier Patient Education  2020 Elsevier Inc.  

## 2020-01-08 LAB — COMPLETE METABOLIC PANEL WITH GFR
AG Ratio: 1.8 (calc) (ref 1.0–2.5)
ALT: 16 U/L (ref 6–29)
AST: 15 U/L (ref 10–35)
Albumin: 4.1 g/dL (ref 3.6–5.1)
Alkaline phosphatase (APISO): 38 U/L (ref 31–125)
BUN: 12 mg/dL (ref 7–25)
CO2: 29 mmol/L (ref 20–32)
Calcium: 9.1 mg/dL (ref 8.6–10.2)
Chloride: 103 mmol/L (ref 98–110)
Creat: 0.8 mg/dL (ref 0.50–1.10)
GFR, Est African American: 102 mL/min/{1.73_m2} (ref >=60)
GFR, Est Non African American: 88 mL/min/{1.73_m2} (ref >=60)
Globulin: 2.3 g/dL (calc) (ref 1.9–3.7)
Glucose, Bld: 88 mg/dL (ref 65–139)
Potassium: 4.2 mmol/L (ref 3.5–5.3)
Sodium: 138 mmol/L (ref 135–146)
Total Bilirubin: 1.4 mg/dL — ABNORMAL HIGH (ref 0.2–1.2)
Total Protein: 6.4 g/dL (ref 6.1–8.1)

## 2020-01-08 LAB — HEMOGLOBIN A1C
Hgb A1c MFr Bld: 4.4 % of total Hgb (ref <5.7)
Mean Plasma Glucose: 80 (calc)
eAG (mmol/L): 4.4 (calc)

## 2020-01-08 LAB — LIPID PANEL W/REFLEX DIRECT LDL
Cholesterol: 171 mg/dL (ref <200)
HDL: 75 mg/dL (ref >=50)
LDL Cholesterol (Calc): 83 mg/dL (calc)
Non-HDL Cholesterol (Calc): 96 mg/dL (calc) (ref <130)
Total CHOL/HDL Ratio: 2.3 (calc) (ref <5.0)
Triglycerides: 47 mg/dL (ref <150)

## 2020-01-08 NOTE — Progress Notes (Signed)
[name],   Cholesterol is fantastic!!! A!C is the best it has ever been at 4.4. GREAT!! Kidney and liver look good.  Calcium looks great. Bilirubin is elevated some. Suggest recheck in 1-2 months just to make sure not trending up.

## 2020-02-02 ENCOUNTER — Other Ambulatory Visit: Payer: Self-pay | Admitting: Infectious Diseases

## 2020-02-02 MED ORDER — POLYMYXIN B-TRIMETHOPRIM 10000-0.1 UNIT/ML-% OP SOLN: 1.0000 [drp] | OPHTHALMIC | 0 refills | Status: AC

## 2020-02-06 DIAGNOSIS — N915 Oligomenorrhea, unspecified: Secondary | ICD-10-CM | POA: Diagnosis not present

## 2020-02-12 DIAGNOSIS — Z3200 Encounter for pregnancy test, result unknown: Secondary | ICD-10-CM | POA: Diagnosis not present

## 2020-02-12 DIAGNOSIS — N6452 Nipple discharge: Secondary | ICD-10-CM | POA: Diagnosis not present

## 2020-02-13 ENCOUNTER — Other Ambulatory Visit: Payer: Self-pay | Admitting: Obstetrics

## 2020-02-13 DIAGNOSIS — N6452 Nipple discharge: Secondary | ICD-10-CM

## 2020-02-21 DIAGNOSIS — L814 Other melanin hyperpigmentation: Secondary | ICD-10-CM | POA: Diagnosis not present

## 2020-02-21 DIAGNOSIS — D2261 Melanocytic nevi of right upper limb, including shoulder: Secondary | ICD-10-CM | POA: Diagnosis not present

## 2020-02-21 DIAGNOSIS — L821 Other seborrheic keratosis: Secondary | ICD-10-CM | POA: Diagnosis not present

## 2020-02-21 DIAGNOSIS — D225 Melanocytic nevi of trunk: Secondary | ICD-10-CM | POA: Diagnosis not present

## 2020-02-21 DIAGNOSIS — L570 Actinic keratosis: Secondary | ICD-10-CM | POA: Diagnosis not present

## 2020-02-26 ENCOUNTER — Ambulatory Visit
Admission: RE | Admit: 2020-02-26 | Discharge: 2020-02-26 | Disposition: A | Discharge status: Home / Self Care | Payer: 59 | Source: Ambulatory Visit | Attending: Obstetrics | Admitting: Obstetrics

## 2020-02-26 ENCOUNTER — Other Ambulatory Visit: Payer: Self-pay

## 2020-02-26 DIAGNOSIS — N6452 Nipple discharge: Secondary | ICD-10-CM | POA: Diagnosis not present

## 2020-02-26 DIAGNOSIS — R922 Inconclusive mammogram: Secondary | ICD-10-CM | POA: Diagnosis not present

## 2020-03-10 MED FILL — NYSTATIN-TRIAMCINOLONE OINT: 100000-0.1 | 10 days supply | Qty: 45 | Fill #0

## 2020-04-22 ENCOUNTER — Telehealth: Payer: 59 | Admitting: Nurse Practitioner

## 2020-04-22 DIAGNOSIS — R059 Cough, unspecified: Secondary | ICD-10-CM

## 2020-04-22 DIAGNOSIS — R05 Cough: Secondary | ICD-10-CM

## 2020-04-22 MED ORDER — BENZONATATE 100 MG PO CAPS: 100.0000 mg | ORAL_CAPSULE | Freq: Three times a day (TID) | ORAL | 0 refills | Status: DC | PRN

## 2020-04-22 MED FILL — BENZONATATE 100 MG CAPS: 100 | 7 days supply | Qty: 20 | Fill #0

## 2020-04-22 NOTE — Progress Notes (Signed)
We are sorry that you are not feeling well.  Here is how we plan to help!  Based on your presentation I believe you most likely have A cough due to a virus.  This is called viral bronchitis and is best treated by rest, plenty of fluids and control of the cough.  You may use Ibuprofen or Tylenol as directed to help your symptoms.     In addition you may use A prescription cough medication called Tessalon Perles 143m. You may take 1-2 capsules every 8 hours as needed for your cough.   From your responses in the eVisit questionnaire you describe inflammation in the upper respiratory tract which is causing a significant cough.  This is commonly called Bronchitis and has four common causes:    Allergies  Viral Infections  Acid Reflux  Bacterial Infection Allergies, viruses and acid reflux are treated by controlling symptoms or eliminating the cause. An example might be a cough caused by taking certain blood pressure medications. You stop the cough by changing the medication. Another example might be a cough caused by acid reflux. Controlling the reflux helps control the cough.  USE OF BRONCHODILATOR ("RESCUE") INHALERS: There is a risk from using your bronchodilator too frequently.  The risk is that over-reliance on a medication which only relaxes the muscles surrounding the breathing tubes can reduce the effectiveness of medications prescribed to reduce swelling and congestion of the tubes themselves.  Although you feel brief relief from the bronchodilator inhaler, your asthma may actually be worsening with the tubes becoming more swollen and filled with mucus.  This can delay other crucial treatments, such as oral steroid medications. If you need to use a bronchodilator inhaler daily, several times per day, you should discuss this with your provider.  There are probably better treatments that could be used to keep your asthma under control.     HOME CARE . Only take medications as instructed by  your medical team. . Complete the entire course of an antibiotic. . Drink plenty of fluids and get plenty of rest. . Avoid close contacts especially the very young and the elderly . Cover your mouth if you cough or cough into your sleeve. . Always remember to wash your hands . A steam or ultrasonic humidifier can help congestion.   GET HELP RIGHT AWAY IF: . You develop worsening fever. . You become short of breath . You cough up blood. . Your symptoms persist after you have completed your treatment plan MAKE SURE YOU   Understand these instructions.  Will watch your condition.  Will get help right away if you are not doing well or get worse.  Your e-visit answers were reviewed by a board certified advanced clinical practitioner to complete your personal care plan.  Depending on the condition, your plan could have included both over the counter or prescription medications. If there is a problem please reply  once you have received a response from your provider. Your safety is important to [place]  If you have drug allergies check your prescription carefully.    You can use MyChart to ask questions about today's visit, request a non-urgent call back, or ask for a work or school excuse for 24 hours related to this e-Visit. If it has been greater than 24 hours you will need to follow up with your provider, or enter a new e-Visit to address those concerns. You will get an e-mail in the next two days asking about your experience.  I  hope that your e-visit has been valuable and will speed your recovery. Thank you for using e-visits.  5-10 minutes spent reviewing and documenting in chart.

## 2020-04-23 DIAGNOSIS — B373 Candidiasis of vulva and vagina: Secondary | ICD-10-CM | POA: Diagnosis not present

## 2020-04-23 DIAGNOSIS — Z113 Encounter for screening for infections with a predominantly sexual mode of transmission: Secondary | ICD-10-CM | POA: Diagnosis not present

## 2020-04-23 DIAGNOSIS — Z114 Encounter for screening for human immunodeficiency virus [HIV]: Secondary | ICD-10-CM | POA: Diagnosis not present

## 2020-04-23 DIAGNOSIS — Z1159 Encounter for screening for other viral diseases: Secondary | ICD-10-CM | POA: Diagnosis not present

## 2020-04-23 MED FILL — NYSTATIN-TRIAMCINOLONE OINT: 100000-0.1 | 10 days supply | Qty: 45 | Fill #0

## 2020-04-23 MED FILL — FLUCONAZOLE 150 MG TABS: 150 | 3 days supply | Qty: 3 | Fill #0

## 2020-06-11 ENCOUNTER — Other Ambulatory Visit (HOSPITAL_COMMUNITY): Payer: Self-pay | Admitting: Obstetrics

## 2020-06-11 MED FILL — FLUCONAZOLE 150 MG TABS: 150 | 7 days supply | Qty: 3 | Fill #0

## 2020-06-16 NOTE — Progress Notes (Signed)
[facility] [place] Phone: [phone] Subjective:   [name], am serving as a scribe for Dr. Hulan Saas. This visit occurred during the SARS-CoV-2 public health emergency.  Safety protocols were in place, including screening questions prior to the visit, additional usage of staff PPE, and extensive cleaning of exam room while observing appropriate contact time as indicated for disinfecting solutions.   I'm seeing this patient by the request  of:  Breeback, Jade L, PA-C  CC:  CC:  Low back pain and mid back pain follow-up   [id number]  LESIELI BRESEE is a 44 y.o. female coming in with complaint of back and neck pain. OMT 08/20/2019. Patient states that 3 weeks ago patient slipped on a rock during a hike. Patient had some pain in lower back afterwards. Residual pain from this slip mostly in the mornings.   Medications patient has been prescribed: Ibuprofen only intermittently          Reviewed prior external information including notes and imaging from previsou exam, outside providers and external EMR if available.   As well as notes that were available from care everywhere and other healthcare systems.  Past medical history, social, surgical and family history all reviewed in electronic medical record.  No pertanent information unless stated regarding to the chief complaint.   Past Medical History:  Diagnosis Date  . Gestational diabetes   . Heart murmur    during pregnancy  . Preeclampsia     Allergies  Allergen Reactions  . Belviq [Lorcaserin Hcl]     Crazy dreams     Review of Systems:  No headache, visual changes, nausea, vomiting, diarrhea, constipation, dizziness, abdominal pain, skin rash, fevers, chills, night sweats, weight loss, swollen lymph nodes, body aches, joint swelling, chest pain, shortness of breath, mood changes. POSITIVE muscle aches  Objective  Blood pressure 122/74,  pulse 68, height 5' 8"  (1.727 m), weight 146 lb (66.2 kg), SpO2 98 %.   General: No apparent distress alert and oriented x3 mood and affect normal, dressed appropriately.  HEENT: Pupils equal, extraocular movements intact  Respiratory: Patient's speak in full sentences and does not appear short of breath  Cardiovascular: No lower extremity edema, non tender, no erythema  Neuro: Cranial nerves II through XII are intact, neurovascularly intact in all extremities with 2+ DTRs and 2+ pulses.  Gait normal with good balance and coordination.  MSK:  Non tender with full range of motion and good stability and symmetric strength and tone of shoulders, elbows, wrist, hip, knee and ankles bilaterally.  Back -low back exam does have some mild loss of lordosis, some tenderness to palpation paraspinal musculature lumbar spine as well as in the thoracolumbar junction.  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  Patient has been lost good amount of weight and encouraged her to continue to be active.  Discussed low protein supplementation.  Icing regimen.  On no medications at the moment.  Follow-up again in 4 to 8 weeks    Nonallopathic problems  Decision today to treat with OMT was based on Physical Exam  After verbal consent patient was  treated with HVLA, ME, FPR techniques in cervical, rib, thoracic,   areas  Patient tolerated the procedure well with improvement in symptoms  Patient given exercises, stretches and lifestyle modifications  See medications in patient instructions if given  Patient will follow up in 12 weeks      The above documentation has been reviewed and is accurate and complete  Lyndal Pulley, DO       Note: This dictation was prepared with Dragon dictation along with smaller phrase technology. Any transcriptional errors that result from this process are unintentional.

## 2020-06-17 ENCOUNTER — Ambulatory Visit (INDEPENDENT_AMBULATORY_CARE_PROVIDER_SITE_OTHER): Payer: 59 | Admitting: Family Medicine

## 2020-06-17 ENCOUNTER — Encounter: Payer: Self-pay | Admitting: Family Medicine

## 2020-06-17 ENCOUNTER — Other Ambulatory Visit: Payer: Self-pay

## 2020-06-17 VITALS — BP 122/74 | HR 68 | Ht 68.0 in | Wt 146.0 lb

## 2020-06-17 DIAGNOSIS — M999 Biomechanical lesion, unspecified: Secondary | ICD-10-CM

## 2020-06-17 DIAGNOSIS — M940 Chondrocostal junction syndrome [Tietze]: Secondary | ICD-10-CM

## 2020-06-17 NOTE — Assessment & Plan Note (Signed)
Patient has had this problem before.  Seems to be chronic with exacerbation.  Patient has been lost good amount of weight and encouraged her to continue to be active.  Discussed low protein supplementation.  Icing regimen.  On no medications at the moment.  Follow-up again in 4 to 8 weeks

## 2020-06-23 ENCOUNTER — Other Ambulatory Visit: Payer: 59

## 2020-06-23 DIAGNOSIS — Z20822 Contact with and (suspected) exposure to covid-19: Secondary | ICD-10-CM | POA: Diagnosis not present

## 2020-06-25 LAB — NOVEL CORONAVIRUS, NAA: SARS-CoV-2, NAA: NOT DETECTED

## 2020-06-25 LAB — SARS-COV-2, NAA 2 DAY TAT

## 2020-08-11 DIAGNOSIS — Z1159 Encounter for screening for other viral diseases: Secondary | ICD-10-CM | POA: Diagnosis not present

## 2020-08-11 DIAGNOSIS — Z113 Encounter for screening for infections with a predominantly sexual mode of transmission: Secondary | ICD-10-CM | POA: Diagnosis not present

## 2020-08-11 DIAGNOSIS — Z118 Encounter for screening for other infectious and parasitic diseases: Secondary | ICD-10-CM | POA: Diagnosis not present

## 2020-08-11 DIAGNOSIS — Z114 Encounter for screening for human immunodeficiency virus [HIV]: Secondary | ICD-10-CM | POA: Diagnosis not present

## 2020-09-03 ENCOUNTER — Ambulatory Visit (INDEPENDENT_AMBULATORY_CARE_PROVIDER_SITE_OTHER): Payer: 59

## 2020-09-03 ENCOUNTER — Encounter: Payer: Self-pay | Admitting: Family Medicine

## 2020-09-03 ENCOUNTER — Ambulatory Visit: Payer: 59 | Admitting: Family Medicine

## 2020-09-03 ENCOUNTER — Other Ambulatory Visit: Payer: Self-pay

## 2020-09-03 VITALS — BP 126/80 | HR 67 | Ht 68.0 in | Wt 151.0 lb

## 2020-09-03 DIAGNOSIS — M7602 Gluteal tendinitis, left hip: Secondary | ICD-10-CM | POA: Diagnosis not present

## 2020-09-03 DIAGNOSIS — G8929 Other chronic pain: Secondary | ICD-10-CM

## 2020-09-03 DIAGNOSIS — M940 Chondrocostal junction syndrome [Tietze]: Secondary | ICD-10-CM

## 2020-09-03 DIAGNOSIS — M545 Low back pain, unspecified: Secondary | ICD-10-CM | POA: Diagnosis not present

## 2020-09-03 DIAGNOSIS — M4727 Other spondylosis with radiculopathy, lumbosacral region: Secondary | ICD-10-CM | POA: Diagnosis not present

## 2020-09-03 DIAGNOSIS — M999 Biomechanical lesion, unspecified: Secondary | ICD-10-CM

## 2020-09-03 DIAGNOSIS — M25552 Pain in left hip: Secondary | ICD-10-CM | POA: Diagnosis not present

## 2020-09-03 NOTE — Assessment & Plan Note (Signed)
Stable, continue HEP and posture RTC in 6 weeks

## 2020-09-03 NOTE — Patient Instructions (Signed)
Good to see you Xrays today Glut stretches Bring treadmill more flat See me again in 2 months

## 2020-09-03 NOTE — Progress Notes (Signed)
[facility] [address] [place] Phone: [phone] Subjective:   I [name] am serving as a Education administrator for Dr. Hulan Saas.  This visit occurred during the SARS-CoV-2 public health emergency.  Safety protocols were in place, including screening questions prior to the visit, additional usage of staff PPE, and extensive cleaning of exam room while observing appropriate contact time as indicated for disinfecting solutions.   I'm seeing this patient by the request  of:  Donella Stade, PA-C  CC: Back pain, hip pain  [id number]  [name] is a 45 y.o. female coming in with complaint of back and neck pain. OMT 06/17/2020. Patient states left hip pain today. Back has been giving her more trouble than usual.  Patient has had more of a gluteal tendinitis.  Patient does not know if this is more of the back versus how much of this is more from her back.  Has had known degenerative disc disease of the lumbar spine.  Patient has been trying to be more active.  Continues to run but is running on the treadmill.  Medications patient has been prescribed: None          Reviewed prior external information including notes and imaging from previsou exam, outside providers and external EMR if available.   As well as notes that were available from care everywhere and other healthcare systems.  Past medical history, social, surgical and family history all reviewed in electronic medical record.  No pertanent information unless stated regarding to the chief complaint.   Past Medical History:  Diagnosis Date  . Gestational diabetes   . Heart murmur    during pregnancy  . Preeclampsia     Allergies  Allergen Reactions  . Belviq [Lorcaserin Hcl]     Crazy dreams     Review of Systems:  No headache, visual changes, nausea, vomiting, diarrhea, constipation, dizziness, abdominal pain, skin rash, fevers, chills, night sweats, weight loss,  swollen lymph nodes, body aches, joint swelling, chest pain, shortness of breath, mood changes. POSITIVE muscle aches  Objective  There were no vitals taken for this visit.   General: No apparent distress alert and oriented x3 mood and affect normal, dressed appropriately.  HEENT: Pupils equal, extraocular movements intact  Respiratory: Patient's speak in full sentences and does not appear short of breath  Cardiovascular: No lower extremity edema, non tender, no erythema  Neuro: Cranial nerves II through XII are intact, neurovascularly intact in all extremities with 2+ DTRs and 2+ pulses.  Gait normal with good balance and coordination.  MSK:  Non tender with full range of motion and good stability and symmetric strength and tone of shoulders, elbows, wrist, hip, knee and ankles bilaterally.  Back -low back exam shows the patient does have some loss of lordosis.  Patient does have some tenderness in the paraspinal musculature of the left side of the back.  Patient does have a mild positive Corky Sox on the left with more tightness in the [place] in the gluteal area.  No defects are noted.  Negative straight leg test.  Osteopathic findings  C6 flexed rotated and side bent left T3 extended rotated and side bent left inhaled rib L2 flexed rotated and side bent right Sacrum left on left       Assessment and Plan: Gluteal tendonitis of left buttock Mild worsening discussed HEP core stability  Discussed new shoes RTC in 6 weeks   Slipped rib syndrome Stable, continue  HEP and posture RTC in 6 weeks      Nonallopathic problems  Decision today to treat with OMT was based on Physical Exam  After verbal consent patient was treated with HVLA, ME, FPR techniques in cervical, rib, thoracic, lumbar, and sacral  areas  Patient tolerated the procedure well with improvement in symptoms  Patient given exercises, stretches and lifestyle modifications  See medications in patient instructions if  given  Patient will follow up in 6 weeks      The above documentation has been reviewed and is accurate and complete Lyndal Pulley, DO       Note: This dictation was prepared with Dragon dictation along with smaller phrase technology. Any transcriptional errors that result from this process are unintentional.

## 2020-09-03 NOTE — Assessment & Plan Note (Signed)
Mild worsening discussed HEP core stability  Discussed new shoes RTC in 6 weeks

## 2020-09-25 ENCOUNTER — Emergency Department (INDEPENDENT_AMBULATORY_CARE_PROVIDER_SITE_OTHER)
Admission: EM | Admit: 2020-09-25 | Discharge: 2020-09-25 | Disposition: A | Discharge status: Home / Self Care | Payer: 59 | Source: Home / Self Care

## 2020-09-25 ENCOUNTER — Ambulatory Visit: Payer: 59 | Admitting: Physician Assistant

## 2020-09-25 ENCOUNTER — Telehealth: Payer: Self-pay | Admitting: Physician Assistant

## 2020-09-25 ENCOUNTER — Other Ambulatory Visit: Payer: Self-pay

## 2020-09-25 ENCOUNTER — Other Ambulatory Visit (HOSPITAL_COMMUNITY): Payer: Self-pay | Admitting: Family Medicine

## 2020-09-25 DIAGNOSIS — R590 Localized enlarged lymph nodes: Secondary | ICD-10-CM

## 2020-09-25 DIAGNOSIS — K118 Other diseases of salivary glands: Secondary | ICD-10-CM

## 2020-09-25 MED ORDER — CEPHALEXIN 750 MG PO CAPS: 750.0000 mg | ORAL_CAPSULE | Freq: Two times a day (BID) | ORAL | 0 refills | Status: DC

## 2020-09-25 MED ORDER — PREDNISONE 20 MG PO TABS: 40.0000 mg | ORAL_TABLET | Freq: Every day | ORAL | 0 refills | Status: DC

## 2020-09-25 MED FILL — CEPHALEXIN 250 MG CAPSULE: 250 | 7 days supply | Qty: 42 | Fill #0

## 2020-09-25 MED FILL — predniSONE 20 MG TABS: 20 | 5 days supply | Qty: 10 | Fill #0

## 2020-09-25 NOTE — ED Provider Notes (Signed)
[name]    CSN: [id number] Arrival date & time: 09/25/20  0933      History   Chief Complaint Chief Complaint  Patient presents with  . Facial Pain    HPI [name] is a 45 y.o. female.   HPI  Patient presents today with acute onset cervical adenopathy which is painful, swelling and tenderness of her parotid glands, with some mild facial pressure.  Symptoms developed abruptly yesterday.  She has had some chills and sensation of fever.  She has taken ibuprofen and presents today with only a low-grade temperature however felt feverish on yesterday.  She also endorses some severe fatigue.  She had a COVID-19 exposure approximately 2 weeks ago and has had 2 subsequent negative Covid test since that time.  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  She denies any difficulty with swallowing had some pain with chewing.  Denies any cough, shortness of breath or chest pain. Past Medical History:  Diagnosis Date  . Gestational diabetes   . Heart murmur    during pregnancy  . Preeclampsia     Patient Active Problem List   Diagnosis Date Noted  . Sore throat 01/07/2020  . Partial tear of left hamstring 08/20/2019  . Hamstring tendinitis 06/03/2019  . Urinary frequency 01/04/2019  . Low energy 01/04/2019  . Gluteal tendonitis of left buttock 03/09/2018  . Hair loss 12/05/2017  . BMI 29.0-29.9,adult 11/25/2016  . 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 Nonallopathic lesion of rib cage 10/14/2016  . Nonallopathic lesion of cervical region 10/14/2016  . Nonallopathic lesion of thoracic region 10/14/2016  . Hip flexor tendinitis, right 06/17/2016  . Abnormal weight gain 09/18/2015  . GDM (gestational diabetes mellitus) 09/18/2015  . 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 Palpitations 11/24/2010  . Murmur 11/24/2010  . Dyspnea 11/24/2010    Past Surgical History:  Procedure Laterality Date  . CESAREAN SECTION    .  DILATION AND EVACUATION  04/23/2011   Procedure: DILATATION AND EVACUATION (D&E);  Surgeon: Floyce Stakes. Fogleman;  Location: [facility];  Service: Gynecology;  Laterality: N/A;  Ultrasound Guidance  . laser iridotomy Bilateral May 2015    OB History   No obstetric history on file.      Home Medications    Prior to Admission medications   Medication Sig Start Date End Date Taking? Authorizing Provider  cephALEXin (KEFLEX) 750 MG capsule Take 1 capsule (750 mg total) by mouth in the morning and at bedtime for 7 days. 09/25/20 10/02/20 Yes Scot Jun, FNP  predniSONE (DELTASONE) 20 MG tablet Take 2 tablets (40 mg total) by mouth daily with breakfast. 09/25/20  Yes Scot Jun, FNP  ASHWAGANDHA PO Take by mouth daily.    [provider]  benzonatate (TESSALON PERLES) 100 MG capsule Take 1 capsule (100 mg total) by mouth 3 (three) times daily as needed. 04/22/20   Hassell Done, Mary-Margaret, FNP  Biotin 1 MG CAPS Take by mouth daily.    [provider]  cholecalciferol (VITAMIN D3) 25 MCG (1000 UT) tablet Take 1,000 Units by mouth daily.    [provider]  Probiotic Product (PROBIOTIC PO) Take by mouth.    [provider]  triamcinolone ointment (KENALOG) 0.5 % Apply 1 application topically 2 (two) times daily. 07/17/19   Stamford Callas, NP    Family History Family History  Problem Relation Age of Onset  . Sudden death Mother  Age 28; ? MI  . Coronary artery disease Father        Age 45  . Diabetes Father   . Hyperlipidemia Maternal Aunt   . Pulmonary fibrosis Maternal Aunt     Social History Social History   Tobacco Use  . Smoking status: Never Smoker  . Smokeless tobacco: Never Used  Substance Use Topics  . Alcohol use: No  . Drug use: No     Allergies   Belviq [lorcaserin hcl]   Review of Systems Review of Systems Pertinent negatives listed in HPI   Physical Exam Triage Vital Signs ED Triage Vitals  Enc Vitals  Group     BP 09/25/20 0952 137/70     Pulse Rate 09/25/20 0952 91     Resp 09/25/20 0952 16     Temp 09/25/20 0952 99.2 F (37.3 C)     Temp Source 09/25/20 0952 Oral     SpO2 09/25/20 0952 99 %     Weight --      Height --      Head Circumference --      Peak Flow --      Pain Score 09/25/20 0950 5     Pain Loc --      Pain Edu? --      Excl. in [place]? --    No data found.  Updated Vital Signs BP 137/70 (BP Location: Right Arm)   Pulse 91   Temp 99.2 F (37.3 C) (Oral)   Resp 16   LMP 08/26/2020   SpO2 99%   Visual Acuity Right Eye Distance:   Left Eye Distance:   Bilateral Distance:    Right Eye Near:   Left Eye Near:    Bilateral Near:     Physical Exam Constitutional:      General: She is not in acute distress.    Appearance: She is ill-appearing.  HENT:     Ears:     Comments: Parotid gland adenopathy    Nose: Nose normal.  Neck:     Comments: Tenderness elicited with cervical adenopathy palpation Cardiovascular:     Rate and Rhythm: Normal rate and regular rhythm.  Pulmonary:     Effort: Pulmonary effort is normal.     Breath sounds: Normal breath sounds.  Musculoskeletal:        General: Normal range of motion.     Cervical back: Tenderness present.  Lymphadenopathy:     Cervical: Cervical adenopathy present.  Skin:    General: Skin is warm and dry.     Capillary Refill: Capillary refill takes less than 2 seconds.  Neurological:     Mental Status: She is alert.  Psychiatric:        Mood and Affect: Mood normal.        Behavior: Behavior normal.        Thought Content: Thought content normal.        Judgment: Judgment normal.      UC Treatments / Results  Labs (all labs ordered are listed, but only abnormal results are displayed) Labs Reviewed  NOVEL CORONAVIRUS, NAA  MUMPS ANTIBODY, IGG  MUMPS ANTIBODY, IGM  CBC WITH DIFFERENTIAL/PLATELET    EKG   Radiology No results found.  Procedures Procedures (including critical care  time)  Medications Ordered in UC Medications - No data to display  Initial Impression / Assessment and Plan / UC Course  I have reviewed the triage vital signs and the nursing notes.  Pertinent  labs & imaging results that were available during my care of the patient were reviewed by me and considered in my medical decision making (see chart for details).    COVID-19 test pending given patient's presentation working of a viral panel to include rule out of possible mumps given the severe profound adenopathy patient presents with today.  Also collected a CBC to evaluate white count. Will cover with prednisone 40 mg once daily and cover with Keflex for possible parotiditis.  Continue Tylenol for management of fever.  Test results should be available within 3 to 5 days.  Continue to hydrate and rest. Final Clinical Impressions(s) / UC Diagnoses   Final diagnoses:  Cervical adenopathy  Parotid gland pain     Discharge Instructions     I have collected lab work which is checking for mumps, viral versus bacterial sources of your symptoms.  Lab work should result within 2 days.  Also collected a Covid test due to recent exposure results for that should be available within 3-5 business days.  In the meantime start prednisone 40 mg once daily and continue warm compresses for management of cervical adenopathy.   ED Prescriptions    Medication Sig Dispense Auth. Provider   predniSONE (DELTASONE) 20 MG tablet Take 2 tablets (40 mg total) by mouth daily with breakfast. 10 tablet Scot Jun, FNP   cephALEXin (KEFLEX) 750 MG capsule Take 1 capsule (750 mg total) by mouth in the morning and at bedtime for 7 days. 14 capsule Scot Jun, FNP     PDMP not reviewed this encounter.   Scot Jun, FNP 09/25/20 1350

## 2020-09-25 NOTE — ED Triage Notes (Signed)
Patient presents to Urgent Care with complaints of facial pressure since yesterday. Patient reports she also felt like she was running a low grade fever. Now feels like her upper neck and jaw are swollen now. Feels like her lymph nodes are "full" and swollen. Difficult to chew, but is maintaining her airway and is able to swallow without difficulty.

## 2020-09-25 NOTE — Discharge Instructions (Addendum)
I have collected lab work which is checking for mumps, viral versus bacterial sources of your symptoms.  Lab work should result within 2 days.  Also collected a Covid test due to recent exposure results for that should be available within 3-5 business days.  In the meantime start prednisone 40 mg once daily and continue warm compresses for management of cervical adenopathy.

## 2020-09-25 NOTE — Telephone Encounter (Signed)
Pt called into the clinic with sinus pressure and lymphnode swelling for 2 days. She is concerned about mumps. No problems breathing, fever, chills, lip swelling. Encouraged to go to UC or wait until a 2pm appt here in clinic.

## 2020-09-26 ENCOUNTER — Encounter: Payer: Self-pay | Admitting: Physician Assistant

## 2020-09-26 LAB — SARS-COV-2, NAA 2 DAY TAT

## 2020-09-26 LAB — NOVEL CORONAVIRUS, NAA: SARS-CoV-2, NAA: NOT DETECTED

## 2020-09-26 NOTE — Progress Notes (Signed)
Your CBC results indicate adenopathy is more an bacterial illness vs viral.  Continue both antibiotics and prednisone. The mumps and COVID labs are pending. Regardless of those lab results continue treatment as prescribed. If your symptoms worsen or you continue to experience fevers following completion of antibiotics, follow-up immediately with primary care provider.  Kindest regards,  Molli Barrows, FNP

## 2020-09-29 LAB — CBC WITH DIFFERENTIAL/PLATELET
Absolute Monocytes: 1428 cells/uL — ABNORMAL HIGH (ref 200–950)
Basophils Absolute: 54 cells/uL (ref 0–200)
Basophils Relative: 0.4 %
Eosinophils Absolute: 95 cells/uL (ref 15–500)
Eosinophils Relative: 0.7 %
HCT: 41.4 % (ref 35.0–45.0)
Hemoglobin: 14.6 g/dL (ref 11.7–15.5)
Lymphs Abs: 1142 cells/uL (ref 850–3900)
MCH: 32.8 pg (ref 27.0–33.0)
MCHC: 35.3 g/dL (ref 32.0–36.0)
MCV: 93 fL (ref 80.0–100.0)
MPV: 10.9 fL (ref 7.5–12.5)
Monocytes Relative: 10.5 %
Neutro Abs: 10880 cells/uL — ABNORMAL HIGH (ref 1500–7800)
Neutrophils Relative %: 80 %
Platelets: 202 10*3/uL (ref 140–400)
RBC: 4.45 10*6/uL (ref 3.80–5.10)
RDW: 12.1 % (ref 11.0–15.0)
Total Lymphocyte: 8.4 %
WBC: 13.6 10*3/uL — ABNORMAL HIGH (ref 3.8–10.8)

## 2020-09-29 LAB — MUMPS ANTIBODY, IGG: Mumps IgG: 35.5 AU/mL

## 2020-09-29 LAB — MUMPS ANTIBODY, IGM: Mumps IgM Value: <1:20 {titer}

## 2020-10-02 ENCOUNTER — Other Ambulatory Visit: Payer: Self-pay

## 2020-10-02 ENCOUNTER — Ambulatory Visit (INDEPENDENT_AMBULATORY_CARE_PROVIDER_SITE_OTHER): Payer: 59 | Admitting: Physician Assistant

## 2020-10-02 ENCOUNTER — Encounter: Payer: Self-pay | Admitting: Physician Assistant

## 2020-10-02 VITALS — BP 112/70 | HR 60 | Temp 97.8°F | Ht 68.0 in | Wt 163.0 lb

## 2020-10-02 DIAGNOSIS — R590 Localized enlarged lymph nodes: Secondary | ICD-10-CM

## 2020-10-02 DIAGNOSIS — K1120 Sialoadenitis, unspecified: Secondary | ICD-10-CM | POA: Diagnosis not present

## 2020-10-02 DIAGNOSIS — R682 Dry mouth, unspecified: Secondary | ICD-10-CM | POA: Diagnosis not present

## 2020-10-02 NOTE — Patient Instructions (Addendum)
Infectious Mononucleosis Infectious mononucleosis is a viral infection. It is often referred to as "mono." It causes symptoms that affect various areas of the body, including the throat, upper air passages, and lymph glands. The liver or spleen may also be affected. The virus spreads from person to person (is contagious) through close contact. The illness is usually not serious, and it typically goes away in 2-4 weeks without treatment. In rare cases, symptoms can be more severe and last longer, sometimes up to several months. What are the causes? This condition is commonly caused by the Epstein-Barr virus. This virus spreads through:  Having contact with an infected person's saliva or other bodily fluids, often through: ? Kissing. ? Sex. ? Coughing. ? Sneezing.  Sharing utensils or drinking glasses with an infected person.  Receiving blood from an infected donor (blood transfusion).  Receiving an organ from an infected donor (organ transplant). What increases the risk? You are more likely to develop this condition if:  You are 60-37 years old. What are the signs or symptoms? Symptoms of this condition usually appear 4-6 weeks after infection. Symptoms may develop slowly and occur at different times. Common symptoms include:  Sore throat.  Headache.  Extreme fatigue.  Muscle aches.  Swollen glands.  Fever.  Poor appetite.  Rash. Other symptoms include:  Enlarged liver or spleen.  Nausea.  Abdominal pain.   How is this diagnosed? This condition may be diagnosed based on:  Your medical history.  Your symptoms.  A physical exam.  Blood tests to confirm the diagnosis. How is this treated? There is no cure for this condition. Infectious mononucleosis usually goes away on its own with time. Treatment can help relieve symptoms and may include:  Taking medicines to relieve pain and fever.  Drinking plenty of fluids.  Getting a lot of rest.  Medicine  (corticosteroids) to reduce swelling. This may be used if swelling in the throat causes breathing or swallowing problems. In some severe cases, treatment may have to be given in a hospital. Follow these instructions at home: Medicines  Take over-the-counter and prescription medicines only as told by your health care provider.  Do not take ampicillin or amoxicillin. This may cause a rash.  If you are under 18, do not take aspirin because of the association with Reye's syndrome. Activity  Rest as needed.  Do not participate in any of the following activities until your health care provider approves: ? Contact sports. You may need to wait at least a month before participating in sports. ? Exercise that requires a lot of energy. ? Heavy lifting.  Gradually resume your normal activities after your fever is gone, or when your health care provider tells you that you can. Be sure to rest when you get tired. General instructions  Avoid kissing or sharing utensils or drinking glasses until your health care provider tells you that you are no longer contagious.  Drink enough fluid to keep your urine pale yellow.  Do not drink alcohol.  If you have a sore throat: ? Gargle with a salt-water mixture 3-4 times a day or as needed. To make a salt-water mixture, completely dissolve -1 tsp (3-6 g) of salt in 1 cup (237 mL) of warm water. ? Eat soft foods. Cold foods such as ice cream or ice pops can soothe a sore throat. ? Try sucking on hard candy.  Wash your hands often with soap and water to avoid spreading the infection. If soap and water are  not available, use hand sanitizer.  Keep all follow-up visits as told by your health care provider. This is important.   How is this prevented?  Avoid contact with people who are infected with mononucleosis. An infected person may not always appear ill, but he or she can still spread the virus.  Avoid sharing utensils, drinking glasses, or  toothbrushes.  Wash your hands frequently with soap and water. If soap and water are not available, use hand sanitizer.  Use the inside of your elbow to cover your mouth when coughing or sneezing.   Contact a health care provider if:  Your fever is not gone after 10 days.  You have swollen lymph nodes that are not back to normal after 4 weeks.  Your activity level is not back to normal after 2 months.  Your skin or the white parts of your eyes turn yellow (jaundice).  You have constipation. This means that you are having: ? Fewer bowel movements in a week than normal. ? Difficulty passing stool. ? Stools that are dry, hard, or larger than normal. Get help right away if:  You have severe pain in your abdomen or shoulder.  You are drooling.  You have trouble swallowing.  You have trouble breathing.  You develop a stiff neck.  You develop a severe headache.  You cannot stop vomiting.  You have jerky movements that you cannot control (seizures).  You are confused.  You have trouble with balance.  Your nose or gums begin to bleed.  You have signs of dehydration. These may include: ? Weakness. ? Sunken eyes. ? Pale skin. ? Dry mouth. ? Rapid breathing or pulse. Summary  Infectious mononucleosis, or "mono," is an infection caused by the Epstein-Barr virus.  The virus that causes this condition is spread through bodily fluids. The virus is most commonly spread by kissing or sharing drinks or utensils with an infected person.  You are more likely to develop this condition if you are 47-68 years old.  Symptoms of this condition include sore throat, headache, fever, swollen glands, muscle aches, extreme fatigue, and swollen liver or spleen.  There is no cure for this condition. Treatment can help relieve symptoms and may include drinking plenty of fluids, getting a lot of rest, and taking medicines. This information is not intended to replace advice given to you by  your health care provider. Make sure you discuss any questions you have with your health care provider. Document Revised: 02/28/2019 Document Reviewed: 05/09/2018 Elsevier Patient Education  2021 [place]. Parotitis  Parotitis means that you have irritation and swelling (inflammation) in one or both of your parotid glands. These glands make saliva. They are found on each side of your face, below and in front of your earlobes. You may or may not have pain with this condition. What are the causes? This condition may be caused by:  Infections from germs (bacteria or viruses).  Something blocking the flow of saliva through the parotid glands. This can be a stone, scar tissue, or a tumor.  Diseases that cause your body's defense system (immune system) to attack healthy cells in your salivary glands. These are called autoimmune diseases. What increases the risk? You are more likely to get this condition if:  You are 98 years old or older.  You do not drink enough fluids (are dehydrated).  You drink too much alcohol.  You have: ? A dry mouth. ? Diabetes. ? Gout. ? A long-term illness.  You do not  take good care of your mouth and teeth (poor dental hygiene).  You have had radiation treatments to the head and neck.  You take certain medicines. What are the signs or symptoms? Symptoms of this condition depend on the cause. They may include:  Swelling under and in front of the ear. This may get worse after you eat.  Redness of the skin over the parotid gland.  Pain and tenderness over the parotid gland. This may get worse after you eat.  Fever or chills.  Pus coming from the ducts inside the mouth.  Dry mouth.  A bad taste in the mouth. How is this treated? Treatment for this condition depends on the cause. Treatment may include:  Antibiotic medicine for an infection from bacteria.  Drinking more fluids.  Removing a stone or obstruction.  Treating a disease that  is causing parotitis.  Surgery to drain an infection, remove a growth, or remove the whole gland. Treatment may not be needed if the swelling goes away with home care. Follow these instructions at home: Medicines  Take over-the-counter and prescription medicines only as told by your doctor.  If you were prescribed an antibiotic medicine, take it as told by your doctor. Do not stop taking the antibiotic even if you start to feel better.   Managing pain and swelling  If told, put heat on the affected area. Do this as often as told by your doctor. Use the heat source that your doctor recommends, such as a moist heat pack or a heating pad. ? Place a towel between your skin and the heat source. ? Leave the heat on for 20-30 minutes. ? Remove the heat if your skin turns bright red. This is very important if you are unable to feel pain, heat, or cold. You may have a greater risk of getting burned.  Gargle with salt water 3-4 times a day or as needed. To make salt water, dissolve -1 tsp (3-6 g) of salt in 1 cup (237 mL) of warm water.  Gently rub your parotid glands as told by your doctor. General instructions  Drink enough fluid to keep your pee (urine) pale yellow.  Keep your mouth clean and moist.  Suck on sour candy. This may help to: ? Make your mouth less dry. ? Make more saliva.  Take good care of your mouth: ? Brush your teeth at least two times a day. ? Floss your teeth every day. ? See your dentist regularly.  Do not use any products that contain nicotine or tobacco. These include cigarettes, e-cigarettes, and chewing tobacco. If you need help quitting, ask your doctor.  Do not drink alcohol.  Keep all follow-up visits as told by your doctor. This is important.   Contact a doctor if:  You have a fever or chills.  You have new symptoms.  Your symptoms get worse.  Your symptoms do not get better with treatment. Get help right away if:  You have trouble breathing or  swallowing. Summary  Parotitis means that you have irritation and swelling (inflammation) in one or both of your parotid glands.  Symptoms include pain and swelling under and in front of the ear.  Treatment for parotitis depends on the cause. In some cases, the condition may go away on its own with home care.  You should drink plenty of fluids, take good care of your mouth, and avoid tobacco products. This information is not intended to replace advice given to you by your health care  provider. Make sure you discuss any questions you have with your health care provider. Document Revised: 02/20/2018 Document Reviewed: 02/20/2018 Elsevier Patient Education  [place].

## 2020-10-02 NOTE — Progress Notes (Signed)
Subjective:    Patient ID: [name], female    DOB: Dec 03, 1975, 45 y.o.   MRN: [id number]  HPI  Patient is a 45 year old female who presents to the clinic to follow-up on cervical adenopathy and parotid gland swelling pain.  She went to urgent care on 09/25/2020 for sudden bilateral parotid and neck swelling.  She was tested for mumps, Covid, CBC.  No major concerns with these labs.  She was treated with prednisone and Keflex.  She was given symptomatic care.  Her lymphadenopathy and swelling did significantly go down.  She has finished her Keflex and prednisone.  She continues to feel very tired and has extreme dry mouth.  She does feel better. No fever or chills or body aches. She does wonder what caused this.    .. Active Ambulatory Problems    Diagnosis Date Noted  . Palpitations 11/24/2010  . Murmur 11/24/2010  . Dyspnea 11/24/2010  . 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 Abnormal weight gain 09/18/2015  . GDM (gestational diabetes mellitus) 09/18/2015  . Hip flexor tendinitis, right 06/17/2016  . 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 Nonallopathic lesion of rib cage 10/14/2016  . Nonallopathic lesion of cervical region 10/14/2016  . Nonallopathic lesion of thoracic region 10/14/2016  . BMI 29.0-29.9,adult 11/25/2016  . Hair loss 12/05/2017  . Gluteal tendonitis of left buttock 03/09/2018  . Urinary frequency 01/04/2019  . Low energy 01/04/2019  . Hamstring tendinitis 06/03/2019  . Partial tear of left hamstring 08/20/2019  . Sore throat 01/07/2020   Resolved Ambulatory Problems    Diagnosis Date Noted  . CONJUNCTIVITIS, VIRAL, LEFT 11/03/2009  . URI 11/03/2009  . UTI (urinary tract infection) 03/16/2015   Past Medical History:  Diagnosis Date  . Gestational diabetes   . Heart murmur   . Preeclampsia     Review of Systems See HPI.     Objective:   Physical Exam Vitals reviewed.  Constitutional:      Appearance: Normal appearance.  Neck:      Comments: Bilateral. Non tender.  Appearance of cervical and parotid swelling gone down.  No tenderness or welling over parotid gland.  Cardiovascular:     Rate and Rhythm: Normal rate and regular rhythm.     Pulses: Normal pulses.     Heart sounds: Normal heart sounds.  Pulmonary:     Effort: Pulmonary effort is normal.     Breath sounds: Normal breath sounds. No wheezing or rhonchi.  Lymphadenopathy:     Cervical: Cervical adenopathy present.  Neurological:     General: No focal deficit present.     Mental Status: She is alert and oriented to person, place, and time.  Psychiatric:        Mood and Affect: Mood normal.           Assessment & Plan:  Marland KitchenMarland KitchenDaphne was seen today for follow-up.  Diagnoses and all orders for this visit:  Parotitis -     Epstein-Barr virus VCA antibody panel -     ANA -     Sed Rate (ESR)  Dry mouth -     Epstein-Barr virus VCA antibody panel -     ANA -     Sed Rate (ESR)  Cervical adenopathy -     Epstein-Barr virus VCA antibody panel -     ANA -     Sed Rate (ESR)   Looking back at CBC. Monocytes were elevated likely mono. Will check mono IgG and  IgM as well as ANA/ESR for more autoimmune findings of parotitis and cervical adenopathy. Just finished prednisone no need to repeat CBC. Discussed timeframe if positive for mono. HO given. I do not think you need any more abx or prednisone at this time. If lymphnodes are not going all the way done, please follow up. Written out of work until 2/28 to rest and regroup.

## 2020-10-05 ENCOUNTER — Encounter: Payer: Self-pay | Admitting: Physician Assistant

## 2020-10-05 LAB — EPSTEIN-BARR VIRUS VCA ANTIBODY PANEL
EBV NA IgG: >600 U/mL — ABNORMAL HIGH
EBV VCA IgG: 582 U/mL — ABNORMAL HIGH
EBV VCA IgM: <36 U/mL

## 2020-10-05 LAB — ANA: Anti Nuclear Antibody (ANA): NEGATIVE

## 2020-10-05 LAB — SEDIMENTATION RATE: Sed Rate: 2 mm/h (ref 0–20)

## 2020-10-05 NOTE — Progress Notes (Signed)
Negative ANA.

## 2020-10-05 NOTE — Progress Notes (Signed)
Sed rate low. Good news for any autoimmune issue. Other labs still pending.

## 2020-10-05 NOTE — Progress Notes (Signed)
Labs do not show current but do show past mono infection. IGM levels do usually stay elevated for 4-6 weeks from recent infection, so it should be positive. Monocytes were still elevated on CBC so could have been another virus which is likely.

## 2020-11-02 ENCOUNTER — Other Ambulatory Visit (HOSPITAL_COMMUNITY): Payer: Self-pay | Admitting: Obstetrics

## 2020-11-02 MED FILL — NYSTATIN-TRIAMCINOLONE OINT: 100000-0.1 | 10 days supply | Qty: 45 | Fill #0

## 2020-11-09 ENCOUNTER — Other Ambulatory Visit: Payer: Self-pay

## 2020-11-09 ENCOUNTER — Ambulatory Visit: Payer: 59 | Admitting: Family Medicine

## 2020-11-09 ENCOUNTER — Encounter: Payer: Self-pay | Admitting: Family Medicine

## 2020-11-09 VITALS — BP 110/76 | HR 60 | Ht 68.0 in | Wt 165.0 lb

## 2020-11-09 DIAGNOSIS — M542 Cervicalgia: Secondary | ICD-10-CM | POA: Diagnosis not present

## 2020-11-09 DIAGNOSIS — M940 Chondrocostal junction syndrome [Tietze]: Secondary | ICD-10-CM | POA: Diagnosis not present

## 2020-11-09 DIAGNOSIS — M999 Biomechanical lesion, unspecified: Secondary | ICD-10-CM

## 2020-11-09 NOTE — Assessment & Plan Note (Signed)
Likely more of a slipped rib syndrome.  Discussed with patient Dr. Epifania Gore exercises, icing regimen, which activities to do which wants to avoid.  Increase activity slowly.  Patient will follow up with me again 6 weeks.  Worsening pain will need to consider the possibility of a post viral neuritis with patient recently having mono.

## 2020-11-09 NOTE — Progress Notes (Signed)
[facility] [address] [place] Phone: [phone] Subjective:   I [name] am serving as a Education administrator for Dr. Hulan Saas.  This visit occurred during the SARS-CoV-2 public health emergency.  Safety protocols were in place, including screening questions prior to the visit, additional usage of staff PPE, and extensive cleaning of exam room while observing appropriate contact time as indicated for disinfecting solutions.   I'm seeing this patient by the request  of:  Donella Stade, PA-C  CC: Back and neck pain  [id number]  [name] is a 45 y.o. female coming in with complaint of back and neck pain. OMT 06/17/2021. Patient states her rib and shoulder is bothering her today. Hasn't done anything different so she is not sure how the pain started. Left shoulder pain yesterday right shoulder pain today.   Medications patient has been prescribed: None         Reviewed prior external information including notes and imaging from previsou exam, outside providers and external EMR if available.   As well as notes that were available from care everywhere and other healthcare systems.  Past medical history, social, surgical and family history all reviewed in electronic medical record.  No pertanent information unless stated regarding to the chief complaint.   Past Medical History:  Diagnosis Date  . Gestational diabetes   . Heart murmur    during pregnancy  . Preeclampsia     Allergies  Allergen Reactions  . Belviq [Lorcaserin Hcl]     Crazy dreams     Review of Systems:  No headache, visual changes, nausea, vomiting, diarrhea, constipation, dizziness, abdominal pain, skin rash, fevers, chills, night sweats, weight loss, swollen lymph nodes, body aches, joint swelling, chest pain, shortness of breath, mood changes. POSITIVE muscle aches  Objective  Blood pressure 110/76, pulse 60, height 5' 8"  (1.727 m), weight 165 lb  (74.8 kg), SpO2 99 %.   General: No apparent distress alert and oriented x3 mood and affect normal, dressed appropriately.  HEENT: Pupils equal, extraocular movements intact  Respiratory: Patient's speak in full sentences and does not appear short of breath  Cardiovascular: No lower extremity edema, non tender, no erythema  Gait normal with good balance and coordination.  MSK:  Non tender with full range of motion and good stability and symmetric strength and tone of shoulders, elbows, wrist, hip, knee and ankles bilaterally.  Back -low back exam does have some mild loss of lordosis.  Tightness noted in the right parascapular region.  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  Discussed with patient Dr. Epifania Gore exercises, icing regimen, which activities to do which wants to avoid.  Increase activity slowly.  Patient will follow up with me again 6 weeks.  Worsening pain will need to consider the possibility of a post viral neuritis with patient recently having mono.   Nonallopathic problems  Decision today to treat with OMT was based on Physical Exam  After verbal consent patient was treated with HVLA, ME, FPR techniques in cervical, rib, thoracic areas  Patient tolerated the procedure well with improvement in symptoms  Patient given exercises, stretches and lifestyle  modifications  See medications in patient instructions if given  Patient will follow up in 4-8 weeks      The above documentation has been reviewed and is accurate and complete Lyndal Pulley, DO       Note: This dictation was prepared with Dragon dictation along with smaller phrase technology. Any transcriptional errors that  result from this process are unintentional.

## 2020-11-09 NOTE — Patient Instructions (Addendum)
Good to see you Neck xray today We can hold on the xray 3 ibuprofen 3 times a day for 3 days Stay active Good luck with planning trip See me again in 5 weeks

## 2020-11-23 ENCOUNTER — Other Ambulatory Visit (HOSPITAL_COMMUNITY): Payer: Self-pay

## 2020-11-23 DIAGNOSIS — B373 Candidiasis of vulva and vagina: Secondary | ICD-10-CM | POA: Diagnosis not present

## 2020-11-23 DIAGNOSIS — N898 Other specified noninflammatory disorders of vagina: Secondary | ICD-10-CM | POA: Diagnosis not present

## 2020-11-23 MED ORDER — FLUCONAZOLE 150 MG PO TABS
450.0000 mg | ORAL_TABLET | ORAL | 0 refills | Status: DC
  Filled 2020-11-23: qty 3, 7d supply, fill #0

## 2020-12-14 DIAGNOSIS — R87810 Cervical high risk human papillomavirus (HPV) DNA test positive: Secondary | ICD-10-CM | POA: Diagnosis not present

## 2020-12-14 DIAGNOSIS — Z6826 Body mass index (BMI) 26.0-26.9, adult: Secondary | ICD-10-CM | POA: Diagnosis not present

## 2020-12-14 DIAGNOSIS — Z01419 Encounter for gynecological examination (general) (routine) without abnormal findings: Secondary | ICD-10-CM | POA: Diagnosis not present

## 2020-12-14 DIAGNOSIS — N898 Other specified noninflammatory disorders of vagina: Secondary | ICD-10-CM | POA: Diagnosis not present

## 2020-12-14 DIAGNOSIS — Z01411 Encounter for gynecological examination (general) (routine) with abnormal findings: Secondary | ICD-10-CM | POA: Diagnosis not present

## 2020-12-14 DIAGNOSIS — Z124 Encounter for screening for malignant neoplasm of cervix: Secondary | ICD-10-CM | POA: Diagnosis not present

## 2020-12-14 DIAGNOSIS — Z780 Asymptomatic menopausal state: Secondary | ICD-10-CM | POA: Diagnosis not present

## 2020-12-14 DIAGNOSIS — Z113 Encounter for screening for infections with a predominantly sexual mode of transmission: Secondary | ICD-10-CM | POA: Diagnosis not present

## 2021-01-19 ENCOUNTER — Other Ambulatory Visit (HOSPITAL_COMMUNITY): Payer: Self-pay

## 2021-01-19 DIAGNOSIS — Z3202 Encounter for pregnancy test, result negative: Secondary | ICD-10-CM | POA: Diagnosis not present

## 2021-01-19 DIAGNOSIS — R87612 Low grade squamous intraepithelial lesion on cytologic smear of cervix (LGSIL): Secondary | ICD-10-CM | POA: Diagnosis not present

## 2021-01-19 DIAGNOSIS — N870 Mild cervical dysplasia: Secondary | ICD-10-CM | POA: Diagnosis not present

## 2021-01-19 DIAGNOSIS — Z7689 Persons encountering health services in other specified circumstances: Secondary | ICD-10-CM | POA: Diagnosis not present

## 2021-01-19 DIAGNOSIS — B977 Papillomavirus as the cause of diseases classified elsewhere: Secondary | ICD-10-CM | POA: Diagnosis not present

## 2021-01-19 MED ORDER — FOLIC ACID 1 MG PO TABS
1.0000 mg | ORAL_TABLET | Freq: Every day | ORAL | 3 refills | Status: DC
  Filled 2021-01-19: qty 90, 90d supply, fill #0
  Filled 2021-06-10: qty 90, 90d supply, fill #1

## 2021-01-25 ENCOUNTER — Ambulatory Visit (INDEPENDENT_AMBULATORY_CARE_PROVIDER_SITE_OTHER): Payer: 59 | Admitting: Physician Assistant

## 2021-01-25 ENCOUNTER — Encounter: Payer: Self-pay | Admitting: Physician Assistant

## 2021-01-25 ENCOUNTER — Telehealth: Payer: Self-pay | Admitting: Neurology

## 2021-01-25 VITALS — BP 127/77 | HR 60 | Ht 68.0 in | Wt 165.0 lb

## 2021-01-25 DIAGNOSIS — N951 Menopausal and female climacteric states: Secondary | ICD-10-CM

## 2021-01-25 DIAGNOSIS — R590 Localized enlarged lymph nodes: Secondary | ICD-10-CM | POA: Diagnosis not present

## 2021-01-25 DIAGNOSIS — Z1159 Encounter for screening for other viral diseases: Secondary | ICD-10-CM | POA: Diagnosis not present

## 2021-01-25 DIAGNOSIS — Z0000 Encounter for general adult medical examination without abnormal findings: Secondary | ICD-10-CM | POA: Diagnosis not present

## 2021-01-25 DIAGNOSIS — Z1211 Encounter for screening for malignant neoplasm of colon: Secondary | ICD-10-CM | POA: Diagnosis not present

## 2021-01-25 DIAGNOSIS — Z131 Encounter for screening for diabetes mellitus: Secondary | ICD-10-CM

## 2021-01-25 DIAGNOSIS — Z13220 Encounter for screening for lipoid disorders: Secondary | ICD-10-CM

## 2021-01-25 NOTE — Telephone Encounter (Signed)
Cologuard order faxed to [phone] with confirmation received. They will contact the patient directly.

## 2021-01-25 NOTE — Progress Notes (Addendum)
Subjective:     [name] is a 45 y.o. female and is here for a comprehensive physical exam. The patient reports problems - having more and more menopausal symptoms and irregular periods .  Social History   Socioeconomic History   Marital status: Divorced    Spouse name: Not on file   Number of children: 1   Years of education: Not on file   Highest education level: Not on file  Occupational History    Employer: [employer]  Tobacco Use   Smoking status: Never   Smokeless tobacco: Never  Vaping Use   Vaping Use: Not on file  Substance and Sexual Activity   Alcohol use: No   Drug use: No   Sexual activity: Yes  Other Topics Concern   Not on file  Social History Narrative   Not on file   Social Determinants of Health   Financial Resource Strain: Not on file  Food Insecurity: Not on file  Transportation Needs: Not on file  Physical Activity: Not on file  Stress: Not on file  Social Connections: Not on file  Intimate Partner Violence: Not on file   Health Maintenance  Topic Date Due   Hepatitis C Screening  Never done   PAP SMEAR-Modifier  12/23/2020   TETANUS/TDAP  10/02/2021 (Originally 08/08/2020)   MAMMOGRAM  01/25/2022 (Originally 07/11/2020)   COLONOSCOPY (Pts 45-13yr Insurance coverage will need to be confirmed)  01/25/2022 (Originally 08/21/2018)   INFLUENZA VACCINE  03/08/2021   COVID-19 Vaccine  Completed   HIV Screening  Completed   Pneumococcal Vaccine [id number] old  Aged Out   HPV VACCINES  Aged Out    The following portions of the patient's history were reviewed and updated as appropriate: allergies, current medications, past family history, past medical history, past social history, past surgical history, and problem list.  Review of Systems Pertinent items noted in HPI and remainder of comprehensive ROS otherwise negative.   Objective:    BP 127/77   Pulse 60   Ht 5' 8"  (1.727 m)   Wt 165 lb (74.8 kg)   SpO2 100%   BMI 25.09 kg/m   General appearance: alert, cooperative, and appears stated age Head: Normocephalic, without obvious abnormality, atraumatic Eyes: conjunctivae/corneas clear. PERRL, EOM's intact. Fundi benign. Ears: normal TM's and external ear canals both ears Nose: Nares normal. Septum midline. Mucosa normal. No drainage or sinus tenderness. Throat: lips, mucosa, and tongue normal; teeth and gums normal Neck: no carotid bruit, no JVD, supple, symmetrical, trachea midline, thyroid not enlarged, symmetric, no tenderness/mass/nodules, and bilateral anterior cervical non tender shotty lymphadenopathy.  Back: symmetric, no curvature. ROM normal. No CVA tenderness. Lungs: clear to auscultation bilaterally Heart: regular rate and rhythm, S1, S2 normal, no murmur, click, rub or gallop Abdomen: soft, non-tender; bowel sounds normal; no masses,  no organomegaly Extremities: extremities normal, atraumatic, no cyanosis or edema Pulses: 2+ and symmetric Skin: Skin color, texture, turgor normal. No rashes or lesions Lymph nodes: Cervical, supraclavicular, and axillary nodes normal. Neurologic: Grossly normal   .. [facility] 01/25/2021 01/07/2020 01/04/2019 12/05/2017 11/25/2016  Decreased Interest 0 0 0 0 0  Down, Depressed, Hopeless 0 0 0 0 0  PHQ - 2 Score 0 0 0 0 0  Altered sleeping - 1 1 0 -  Tired, decreased energy - 0 1 1 -  Change in appetite - 0 0 0 -  Feeling bad or failure about yourself  - 0 0 0 -  Trouble concentrating - 0 0 0 -  Moving slowly or fidgety/restless - 0 0 0 -  Suicidal thoughts - 0 0 0 -  PHQ-9 Score - 1 2 1  -  Difficult doing work/chores - Not difficult at all Not difficult at all Not difficult at all -    Assessment:    Healthy female exam.      Plan:   Marland KitchenMarland KitchenDaphne was seen today for annual exam.  Diagnoses and all orders for this visit:  Routine physical examination -     Hepatitis C Antibody -     CBC with Differential/Platelet  Screening for diabetes  mellitus -     COMPLETE METABOLIC PANEL WITH GFR  Screening for lipid disorders -     Lipid Panel w/reflex Direct LDL  Colon cancer screening -     Cologuard  Encounter for hepatitis C screening test for low risk patient -     Hepatitis C Antibody  Peri-menopausal  .. Discussed 150 minutes of exercise a week.  Encouraged vitamin D 1000 units and Calcium 1397m or 4 servings of dairy a day.  Fasting labs ordered.  PHQ to goal.  Pt declined colonoscopy and opted for cologuard. No family hx of personal symptoms.  Mammogram pt will schedule.  Pap is UTD and managed by GYN.  Covid vaccine x3 UTD.   Discussed menopausal symptoms and herbal remedies.   Pt did have some lymphadenopathy she is recovering from mono. If enlarging or not resolving in next 4 weeks get u/s.    See After Visit Summary for Counseling Recommendations

## 2021-01-25 NOTE — Patient Instructions (Addendum)
https://www.womenshealth.gov/menopause/menopause-basics"> https://www.clinicalkey.com">  Menopause Menopause is the normal time of a woman's life when menstrual periods stop completely. It marks the natural end to a woman's ability to become pregnant. It can be defined as the absence of a menstrual period for 12 months without another medical cause. The transition to menopause (perimenopause) most often happens between the ages of 12 and 65, and can last for many years. During perimenopause, hormone levels change in your body, which can cause symptoms and affect your health. Menopause may increase your risk for: Weakened bones (osteoporosis), which causes fractures. Depression. Hardening and narrowing of the arteries (atherosclerosis), which can cause heart attacks and strokes. What are the causes? This condition is usually caused by a natural change in hormone levels that happens as you get older. The condition may also be caused by changes that are not natural, including: Surgery to remove both ovaries (surgical menopause). Side effects from some medicines, such as chemotherapy used to treat cancer (chemical menopause). What increases the risk? This condition is more likely to start at an earlier age if you have certain medical conditions or have undergone treatments, including: A tumor of the pituitary gland in the brain. A disease that affects the ovaries and hormones. Certain cancer treatments, such as chemotherapy or hormone therapy, or radiation therapy on the pelvis. Heavy smoking and excessive alcohol use. Family history of early menopause. This condition is also more likely to develop earlier in women who are verythin. What are the signs or symptoms? Symptoms of this condition include: Hot flashes. Irregular menstrual periods. Night sweats. Changes in feelings about sex. This could be a decrease in sex drive or an increased discomfort around your sexuality. Vaginal dryness and  thinning of the vaginal walls. This may cause painful sex. Dryness of the skin and development of wrinkles. Headaches. Problems sleeping (insomnia). Mood swings or irritability. Memory problems. Weight gain. Hair growth on the face and chest. Bladder infections or problems with urinating. How is this diagnosed? This condition is diagnosed based on your medical history, a physical exam, your age, your menstrual history, and your symptoms. Hormone tests may also bedone. How is this treated? In some cases, no treatment is needed. You and your health care provider should make a decision together about whether treatment is necessary. Treatment will be based on your individual condition and preferences. Treatment for this condition focuses on managing symptoms. Treatment may include: Menopausal hormone therapy (MHT). Medicines to treat specific symptoms or complications. Acupuncture. Vitamin or herbal supplements. Before starting treatment, make sure to let your health care provider know if you have a personal or family history of these conditions: Heart disease. Breast cancer. Blood clots. Diabetes. Osteoporosis. Follow these instructions at home: Lifestyle Do not use any products that contain nicotine or tobacco, such as cigarettes, e-cigarettes, and chewing tobacco. If you need help quitting, ask your health care provider. Get at least 30 minutes of physical activity on 5 or more days each week. Avoid alcoholic and caffeinated beverages, as well as spicy foods. This may help prevent hot flashes. Get 7-8 hours of sleep each night. If you have hot flashes, try: Dressing in layers. Avoiding things that may trigger hot flashes, such as spicy food, warm places, or stress. Taking slow, deep breaths when a hot flash starts. Keeping a fan in your home and office. Find ways to manage stress, such as deep breathing, meditation, or journaling. Consider going to group therapy with other women who  are having menopause symptoms. Ask your  health care provider about recommended group therapy meetings. Eating and drinking  Eat a healthy, balanced diet that contains whole grains, lean protein, low-fat dairy, and plenty of fruits and vegetables. Your health care provider may recommend adding more soy to your diet. Foods that contain soy include tofu, tempeh, and soy milk. Eat plenty of foods that contain calcium and vitamin D for bone health. Items that are rich in calcium include low-fat milk, yogurt, beans, almonds, sardines, broccoli, and kale.  Medicines Take over-the-counter and prescription medicines only as told by your health care provider. Talk with your health care provider before starting any herbal supplements. If prescribed, take vitamins and supplements as told by your health care provider. General instructions  Keep track of your menstrual periods, including: When they occur. How heavy they are and how long they last. How much time passes between periods. Keep track of your symptoms, noting when they start, how often you have them, and how long they last. Use vaginal lubricants or moisturizers to help with vaginal dryness and improve comfort during sex. Keep all follow-up visits. This is important. This includes any group therapy or counseling.  Contact a health care provider if: You are still having menstrual periods after age 31. You have pain during sex. You have not had a period for 12 months and you develop vaginal bleeding. Get help right away if you have: Severe depression. Excessive vaginal bleeding. Pain when you urinate. A fast or irregular heartbeat (palpitations). Severe headaches. Abdominal pain or severe indigestion. Summary Menopause is a normal time of life when menstrual periods stop completely. It is usually defined as the absence of a menstrual period for 12 months without another medical cause. The transition to menopause (perimenopause) most often  happens between the ages of 89 and 26 and can last for several years. Symptoms can be managed through medicines, lifestyle changes, and complementary therapies such as acupuncture. Eat a balanced diet that is rich in nutrients to promote bone health and heart health and to manage symptoms during menopause. This information is not intended to replace advice given to you by your health care provider. Make sure you discuss any questions you have with your healthcare provider. Document Revised: 04/24/2020 Document Reviewed: 01/09/2020 Elsevier Patient Education  [place] Maintenance, Female Adopting a healthy lifestyle and getting preventive care are important in promoting health and wellness. Ask your health care provider about: The right schedule for you to have regular tests and exams. Things you can do on your own to prevent diseases and keep yourself healthy. What should I know about diet, weight, and exercise? Eat a healthy diet  Eat a diet that includes plenty of vegetables, fruits, low-fat dairy products, and lean protein. Do not eat a lot of foods that are high in solid fats, added sugars, or sodium.  Maintain a healthy weight Body mass index (BMI) is used to identify weight problems. It estimates body fat based on height and weight. Your health care provider can help determineyour BMI and help you achieve or maintain a healthy weight. Get regular exercise Get regular exercise. This is one of the most important things you can do for your health. Most adults should: Exercise for at least 150 minutes each week. The exercise should increase your heart rate and make you sweat (moderate-intensity exercise). Do strengthening exercises at least twice a week. This is in addition to the moderate-intensity exercise. Spend less time sitting. Even light physical activity can be beneficial. Watch cholesterol  and blood lipids Have your blood tested for lipids and cholesterol at 47  years of age, then havethis test every 5 years. Have your cholesterol levels checked more often if: Your lipid or cholesterol levels are high. You are older than 47 years of age. You are at high risk for heart disease. What should I know about cancer screening? Depending on your health history and family history, you may need to have cancer screening at various ages. This may include screening for: Breast cancer. Cervical cancer. Colorectal cancer. Skin cancer. Lung cancer. What should I know about heart disease, diabetes, and high blood pressure? Blood pressure and heart disease High blood pressure causes heart disease and increases the risk of stroke. This is more likely to develop in people who have high blood pressure readings, are of African descent, or are overweight. Have your blood pressure checked: Every 3-5 years if you are 30-60 years of age. Every year if you are 8 years old or older. Diabetes Have regular diabetes screenings. This checks your fasting blood sugar level. Have the screening done: Once every three years after age 30 if you are at a normal weight and have a low risk for diabetes. More often and at a younger age if you are overweight or have a high risk for diabetes. What should I know about preventing infection? Hepatitis B If you have a higher risk for hepatitis B, you should be screened for this virus. Talk with your health care provider to find out if you are at risk forhepatitis B infection. Hepatitis C Testing is recommended for: Everyone born from [birth year] through 1967. Anyone with known risk factors for hepatitis C. Sexually transmitted infections (STIs) Get screened for STIs, including gonorrhea and chlamydia, if: You are sexually active and are younger than 47 years of age. You are older than 47 years of age and your health care provider tells you that you are at risk for this type of infection. Your sexual activity has changed since you were last  screened, and you are at increased risk for chlamydia or gonorrhea. Ask your health care provider if you are at risk. Ask your health care provider about whether you are at high risk for HIV. Your health care provider may recommend a prescription medicine to help prevent HIV infection. If you choose to take medicine to prevent HIV, you should first get tested for HIV. You should then be tested every 3 months for as long as you are taking the medicine. Pregnancy If you are about to stop having your period (premenopausal) and you may become pregnant, seek counseling before you get pregnant. Take 400 to 800 micrograms (mcg) of folic acid every day if you become pregnant. Ask for birth control (contraception) if you want to prevent pregnancy. Osteoporosis and menopause Osteoporosis is a disease in which the bones lose minerals and strength with aging. This can result in bone fractures. If you are 94 years old or older, or if you are at risk for osteoporosis and fractures, ask your health care provider if you should: Be screened for bone loss. Take a calcium or vitamin D supplement to lower your risk of fractures. Be given hormone replacement therapy (HRT) to treat symptoms of menopause. Follow these instructions at home: Lifestyle Do not use any products that contain nicotine or tobacco, such as cigarettes, e-cigarettes, and chewing tobacco. If you need help quitting, ask your health care provider. Do not use street drugs. Do not share needles. Ask your health care  provider for help if you need support or information about quitting drugs. Alcohol use Do not drink alcohol if: Your health care provider tells you not to drink. You are pregnant, may be pregnant, or are planning to become pregnant. If you drink alcohol: Limit how much you use to 0-1 drink a day. Limit intake if you are breastfeeding. Be aware of how much alcohol is in your drink. In the [place] one drink equals one 12 oz bottle of beer  (355 mL), one 5 oz glass of wine (148 mL), or one 1 oz glass of hard liquor (44 mL). General instructions Schedule regular health, dental, and eye exams. Stay current with your vaccines. Tell your health care provider if: You often feel depressed. You have ever been abused or do not feel safe at home. Summary Adopting a healthy lifestyle and getting preventive care are important in promoting health and wellness. Follow your health care provider's instructions about healthy diet, exercising, and getting tested or screened for diseases. Follow your health care provider's instructions on monitoring your cholesterol and blood pressure. This information is not intended to replace advice given to you by your health care provider. Make sure you discuss any questions you have with your healthcare provider. Document Revised: 07/18/2018 Document Reviewed: 07/18/2018 Elsevier Patient Education  2022 [employer].

## 2021-01-26 DIAGNOSIS — N951 Menopausal and female climacteric states: Secondary | ICD-10-CM | POA: Insufficient documentation

## 2021-01-26 DIAGNOSIS — R590 Localized enlarged lymph nodes: Secondary | ICD-10-CM | POA: Insufficient documentation

## 2021-01-27 DIAGNOSIS — Z1159 Encounter for screening for other viral diseases: Secondary | ICD-10-CM | POA: Diagnosis not present

## 2021-01-27 DIAGNOSIS — Z13220 Encounter for screening for lipoid disorders: Secondary | ICD-10-CM | POA: Diagnosis not present

## 2021-01-27 DIAGNOSIS — Z0000 Encounter for general adult medical examination without abnormal findings: Secondary | ICD-10-CM | POA: Diagnosis not present

## 2021-01-27 DIAGNOSIS — Z131 Encounter for screening for diabetes mellitus: Secondary | ICD-10-CM | POA: Diagnosis not present

## 2021-01-28 LAB — HEPATITIS C ANTIBODY
Hepatitis C Ab: NONREACTIVE
SIGNAL TO CUT-OFF: 0.01 (ref <1.00)

## 2021-01-28 LAB — CBC WITH DIFFERENTIAL/PLATELET
Absolute Monocytes: 713 cells/uL (ref 200–950)
Basophils Absolute: 59 cells/uL (ref 0–200)
Basophils Relative: 0.9 %
Eosinophils Absolute: 198 cells/uL (ref 15–500)
Eosinophils Relative: 3 %
HCT: 41 % (ref 35.0–45.0)
Hemoglobin: 14.5 g/dL (ref 11.7–15.5)
Lymphs Abs: 1683 cells/uL (ref 850–3900)
MCH: 32 pg (ref 27.0–33.0)
MCHC: 35.4 g/dL (ref 32.0–36.0)
MCV: 90.5 fL (ref 80.0–100.0)
MPV: 10.9 fL (ref 7.5–12.5)
Monocytes Relative: 10.8 %
Neutro Abs: 3947 cells/uL (ref 1500–7800)
Neutrophils Relative %: 59.8 %
Platelets: 184 10*3/uL (ref 140–400)
RBC: 4.53 10*6/uL (ref 3.80–5.10)
RDW: 11.6 % (ref 11.0–15.0)
Total Lymphocyte: 25.5 %
WBC: 6.6 10*3/uL (ref 3.8–10.8)

## 2021-01-28 LAB — COMPLETE METABOLIC PANEL WITHOUT GFR
AG Ratio: 1.9 (calc) (ref 1.0–2.5)
ALT: 15 U/L (ref 6–29)
AST: 13 U/L (ref 10–35)
Albumin: 4.2 g/dL (ref 3.6–5.1)
Alkaline phosphatase (APISO): 40 U/L (ref 31–125)
BUN: 19 mg/dL (ref 7–25)
CO2: 29 mmol/L (ref 20–32)
Calcium: 9.1 mg/dL (ref 8.6–10.2)
Chloride: 104 mmol/L (ref 98–110)
Creat: 0.75 mg/dL (ref 0.50–1.10)
GFR, Est African American: 110 mL/min/1.73m2 (ref >=60)
GFR, Est Non African American: 95 mL/min/1.73m2 (ref >=60)
Globulin: 2.2 g/dL (ref 1.9–3.7)
Glucose, Bld: 94 mg/dL (ref 65–99)
Potassium: 4.1 mmol/L (ref 3.5–5.3)
Sodium: 139 mmol/L (ref 135–146)
Total Bilirubin: 0.5 mg/dL (ref 0.2–1.2)
Total Protein: 6.4 g/dL (ref 6.1–8.1)

## 2021-01-28 LAB — LIPID PANEL W/REFLEX DIRECT LDL
Cholesterol: 157 mg/dL (ref <200)
HDL: 72 mg/dL (ref >=50)
LDL Cholesterol (Calc): 72 mg/dL
Non-HDL Cholesterol (Calc): 85 mg/dL (ref <130)
Total CHOL/HDL Ratio: 2.2 (calc) (ref <5.0)
Triglycerides: 51 mg/dL (ref <150)

## 2021-01-28 NOTE — Progress Notes (Signed)
[name],   Normal kidney, liver, glucose.  Cholesterol looks great.  No anemia.

## 2021-02-03 DIAGNOSIS — Z1211 Encounter for screening for malignant neoplasm of colon: Secondary | ICD-10-CM | POA: Diagnosis not present

## 2021-02-04 LAB — COLOGUARD: Cologuard: NEGATIVE

## 2021-02-10 LAB — COLOGUARD: COLOGUARD: NEGATIVE

## 2021-02-11 NOTE — Telephone Encounter (Signed)
LMOM letting patient know Cologuard negative. Abstracted. Patient to call back with any questions.

## 2021-02-22 DIAGNOSIS — L814 Other melanin hyperpigmentation: Secondary | ICD-10-CM | POA: Diagnosis not present

## 2021-02-22 DIAGNOSIS — D2272 Melanocytic nevi of left lower limb, including hip: Secondary | ICD-10-CM | POA: Diagnosis not present

## 2021-02-22 DIAGNOSIS — D225 Melanocytic nevi of trunk: Secondary | ICD-10-CM | POA: Diagnosis not present

## 2021-02-22 DIAGNOSIS — L821 Other seborrheic keratosis: Secondary | ICD-10-CM | POA: Diagnosis not present

## 2021-03-02 ENCOUNTER — Other Ambulatory Visit: Payer: Self-pay | Admitting: Obstetrics

## 2021-03-02 DIAGNOSIS — Z1231 Encounter for screening mammogram for malignant neoplasm of breast: Secondary | ICD-10-CM

## 2021-03-09 ENCOUNTER — Ambulatory Visit: Payer: 59

## 2021-03-11 ENCOUNTER — Other Ambulatory Visit (HOSPITAL_BASED_OUTPATIENT_CLINIC_OR_DEPARTMENT_OTHER): Payer: Self-pay

## 2021-03-11 ENCOUNTER — Emergency Department
Admission: RE | Admit: 2021-03-11 | Discharge: 2021-03-11 | Disposition: A | Discharge status: Home / Self Care | Payer: 59 | Source: Ambulatory Visit

## 2021-03-11 ENCOUNTER — Other Ambulatory Visit: Payer: Self-pay

## 2021-03-11 VITALS — BP 114/70 | HR 95 | Temp 98.9°F | Resp 18 | Ht 67.0 in | Wt 155.0 lb

## 2021-03-11 DIAGNOSIS — U071 COVID-19: Secondary | ICD-10-CM | POA: Diagnosis not present

## 2021-03-11 DIAGNOSIS — J029 Acute pharyngitis, unspecified: Secondary | ICD-10-CM

## 2021-03-11 MED ORDER — PREDNISONE 20 MG PO TABS
ORAL_TABLET | ORAL | 0 refills | Status: DC
  Filled 2021-03-11: qty 15, 5d supply, fill #0

## 2021-03-11 MED ORDER — NIRMATRELVIR & RITONAVIR 20 X 150 MG & 10 X 100MG PO TBPK
3.0000 | ORAL_TABLET | Freq: Two times a day (BID) | ORAL | 0 refills | Status: AC
  Filled 2021-03-11: qty 30, 5d supply, fill #0

## 2021-03-11 NOTE — Discharge Instructions (Addendum)
Advised patient to take medication as directed with food to completion encourage patient to increase daily water intake while taking medications.

## 2021-03-11 NOTE — ED Triage Notes (Signed)
Pt presents to Urgent Care with c/o sore throat, nasal congestion, and ear fullness x 3 days. Tested positive for COVID two days ago. Initially had body aches and fever.

## 2021-03-11 NOTE — ED Provider Notes (Signed)
[name]    CSN: [id number] Arrival date & time: 03/11/21  0817      History   Chief Complaint Chief Complaint  Patient presents with   Covid Positive   Sore Throat   Ear Fullness    HPI [name] is a 45 y.o. female.   HPI 45 year old female presents with sore throat, nasal congestion, and bilateral ear fullness x 3 days reports testing positive for COVID-19 2 days ago initially had body aches and fever.  Significant for palpitations, murmur, and bilateral iridectomy.  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 Fogleman;  Location: [facility];  Service: Gynecology;  Laterality: N/A;  Ultrasound Guidance   laser iridotomy Bilateral May 2015    OB History   No obstetric history on file.      Home Medications    Prior to Admission medications   Medication Sig Start Date End Date Taking?  Authorizing Provider  ibuprofen (ADVIL) 200 MG tablet Take 200 mg by mouth every 6 (six) hours as needed.   Yes [provider]  nirmatrelvir/ritonavir EUA (PAXLOVID) TABS Take 3 tablets by mouth 2 (two) times daily for 5 days. Patient GFR is >60. Take nirmatrelvir (150 mg) two tablets twice daily for 5 days and ritonavir (100 mg) one tablet twice daily for 5 days. 03/11/21 03/16/21 Yes Eliezer Lofts, FNP  predniSONE (DELTASONE) 20 MG tablet Take 3 tabs PO daily x 5 days. 03/11/21  Yes Eliezer Lofts, FNP  Biotin 1 MG CAPS Take by mouth daily.    [provider]  cholecalciferol (VITAMIN D3) 25 MCG (1000 UT) tablet Take 1,000 Units by mouth daily.    [provider]  folic acid (FOLVITE) 1 MG tablet Take 1 tablet (1 mg total) by mouth daily. 01/19/21     nystatin-triamcinolone ointment (MYCOLOG) APPLY TO THE AFFECTED AREA(S) TOPICALLY 2 TIMES A DAY FOR 10 DAYS AS NEEDED 11/02/20 11/02/21  Aloha Gell, MD  Probiotic Product (PROBIOTIC PO) Take by mouth.    [provider]  triamcinolone ointment (KENALOG) 0.5 % Apply 1 application topically 2 (two) times daily. 07/17/19   Ayden Callas, NP    Family History Family History  Problem Relation Age of Onset   Sudden death Mother        Age 22; ? MI   Coronary artery disease Father  Age 108   Diabetes Father    Hyperlipidemia Maternal Aunt    Pulmonary fibrosis Maternal Aunt     Social History Social History   Tobacco Use   Smoking status: Never   Smokeless tobacco: Never  Vaping Use   Vaping Use: Never used  Substance Use Topics   Alcohol use: Yes    Comment: occasionally   Drug use: No     Allergies   Belviq [lorcaserin hcl]   Review of Systems Review of Systems  Constitutional:  Positive for chills, fatigue and fever.  HENT:  Positive for congestion, ear pain, sore throat and trouble swallowing.   All other systems reviewed and are negative.   Physical Exam Triage Vital  Signs ED Triage Vitals  Enc Vitals Group     BP 03/11/21 0836 114/70     Pulse Rate 03/11/21 0836 95     Resp 03/11/21 0836 18     Temp 03/11/21 0836 98.9 F (37.2 C)     Temp Source 03/11/21 0836 Oral     SpO2 03/11/21 0836 97 %     Weight 03/11/21 0831 155 lb (70.3 kg)     Height 03/11/21 0831 5' 7"  (1.702 m)     Head Circumference --      Peak Flow --      Pain Score 03/11/21 0831 5     Pain Loc --      Pain Edu? --      Excl. in [place]? --    No data found.  Updated Vital Signs BP 114/70 (BP Location: Right Arm)   Pulse 95   Temp 98.9 F (37.2 C) (Oral)   Resp 18   Ht 5' 7"  (1.702 m)   Wt 155 lb (70.3 kg)   LMP 12/30/2020   SpO2 97%   BMI 24.28 kg/m       Physical Exam Vitals and nursing note reviewed.  Constitutional:      Appearance: Normal appearance. She is normal weight.  HENT:     Head: Normocephalic and atraumatic.     Right Ear: Tympanic membrane, ear canal and external ear normal.     Left Ear: Tympanic membrane, ear canal and external ear normal.     Mouth/Throat:     Mouth: Mucous membranes are moist.     Pharynx: Uvula midline. Pharyngeal swelling, posterior oropharyngeal erythema and uvula swelling present.  Eyes:     Extraocular Movements: Extraocular movements intact.     Conjunctiva/sclera: Conjunctivae normal.     Pupils: Pupils are equal, round, and reactive to light.  Cardiovascular:     Rate and Rhythm: Normal rate and regular rhythm.     Pulses: Normal pulses.     Heart sounds: Normal heart sounds.  Pulmonary:     Effort: Pulmonary effort is normal. No respiratory distress.     Breath sounds: Normal breath sounds. No stridor. No wheezing, rhonchi or rales.  Chest:     Chest wall: Tenderness present.  Musculoskeletal:        General: Normal range of motion.     Cervical back: Normal range of motion and neck supple. Tenderness present.  Lymphadenopathy:     Cervical: Cervical adenopathy present.  Skin:    General: Skin is warm.   Neurological:     General: No focal deficit present.     Mental Status: She is alert and oriented to person, place, and time.  Psychiatric:        Mood and  Affect: Mood normal.        Behavior: Behavior normal.     UC Treatments / Results  Labs (all labs ordered are listed, but only abnormal results are displayed) Labs Reviewed - No data to display  EKG   Radiology No results found.  Procedures Procedures (including critical care time)  Medications Ordered in UC Medications - No data to display  Initial Impression / Assessment and Plan / UC Course  I have reviewed the triage vital signs and the nursing notes.  Pertinent labs & imaging results that were available during my care of the patient were reviewed by me and considered in my medical decision making (see chart for details).     MDM: 1.  COVID-19-Rx'd Paxlovid; 2.  Acute pharyngitis-Rx'd Prednisone burst 60 mg daily x 5 days.  Patient discharged home, hemodynamically stable. Final Clinical Impressions(s) / UC Diagnoses   Final diagnoses:  COVID-19  Acute pharyngitis, unspecified etiology     Discharge Instructions      Advised patient to take medication as directed with food to completion encourage patient to increase daily water intake while taking medications.     ED Prescriptions     Medication Sig Dispense Auth. Provider   predniSONE (DELTASONE) 20 MG tablet Take 3 tabs PO daily x 5 days. 15 tablet Eliezer Lofts, FNP   nirmatrelvir/ritonavir EUA (PAXLOVID) TABS Take 3 tablets by mouth 2 (two) times daily for 5 days. Patient GFR is >60. Take nirmatrelvir (150 mg) two tablets twice daily for 5 days and ritonavir (100 mg) one tablet twice daily for 5 days. 30 tablet Eliezer Lofts, FNP      PDMP not reviewed this encounter.   Eliezer Lofts, [place] 03/11/21 [phone]

## 2021-03-12 ENCOUNTER — Telehealth: Payer: 59 | Admitting: Physician Assistant

## 2021-04-12 NOTE — Progress Notes (Signed)
[facility] [address] [place] Phone: [phone] Subjective:   [name], am serving as a scribe for Dr. Hulan Saas. This visit occurred during the SARS-CoV-2 public health emergency.  Safety protocols were in place, including screening questions prior to the visit, additional usage of staff PPE, and extensive cleaning of exam room while observing appropriate contact time as indicated for disinfecting solutions.   I'm seeing this patient by the request  of:  Donella Stade, PA-C  CC: Neck pain and back pain follow-up  [id number]  [name] is a 45 y.o. female coming in with complaint of back and neck pain. OMT on 11/09/2020.  Patient states that the pain is intermittent and there isn't anything specific that causes it to begin.  Patient did move boxes with a new house movement.  Medications patient has been prescribed: None          Past Medical History:  Diagnosis Date   Gestational diabetes    Heart murmur    during pregnancy   Preeclampsia     Allergies  Allergen Reactions   Belviq [Lorcaserin Hcl]     Crazy dreams     Review of Systems:  No headache, visual changes, nausea, vomiting, diarrhea, constipation, dizziness, abdominal pain, skin rash, fevers, chills, night sweats, weight loss, swollen lymph nodes, body aches, joint swelling, chest pain, shortness of breath, mood changes. POSITIVE muscle aches  Objective  Blood pressure 120/80, pulse 67, height 5' 7"  (1.702 m), weight 165 lb (74.8 kg), SpO2 95 %.   General: No apparent distress alert and oriented x3 mood and affect normal, dressed appropriately.  HEENT: Pupils equal, extraocular movements intact  Respiratory: Patient's speak in full sentences and does not appear short of breath  Cardiovascular: No lower extremity edema, non tender, no erythema  Making urine still has tightness noted.  Seems to have tightness in the parascapular region as  well.  Some very mild limited sidebending of the neck bilaterally.  Low back to some mild loss of lordosis also noted.  Tightness noted of the right parascapular region.  Osteopathic findings  C6 flexed rotated and side bent right T4 extended rotated and side bent right inhaled rib T9 extended rotated and side bent left L1 flexed rotated and side bent right Sacrum right on right       Assessment and Plan:  Peri-menopausal Started gabapentin.  Warned of potential side effects.  Patient could be a candidate for Effexor as well.  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  Patient was doing a lot more lifting.  He did do some traveling as well.  Discussed with patient to monitor for any type of side effects.  Gabapentin.  Follow-up again in 4 to 8 weeks.  Presented once again for Effexor.   Nonallopathic problems  Decision today to treat with OMT was based on Physical Exam  After verbal consent patient was treated with HVLA, ME, FPR techniques in cervical, rib, thoracic, lumbar, and sacral  areas  Patient tolerated the procedure well with improvement in symptoms  Patient given exercises, stretches and lifestyle modifications  See medications in patient instructions if given  Patient will follow up in 4-8 weeks      The above documentation has been reviewed and is accurate and complete Julie Pulley, DO        Note: This dictation was prepared with Dragon dictation along with  smaller phrase technology. Any transcriptional errors that result from this process are unintentional.

## 2021-04-13 ENCOUNTER — Ambulatory Visit: Payer: 59 | Admitting: Family Medicine

## 2021-04-13 ENCOUNTER — Other Ambulatory Visit (HOSPITAL_COMMUNITY): Payer: Self-pay

## 2021-04-13 ENCOUNTER — Other Ambulatory Visit: Payer: Self-pay

## 2021-04-13 ENCOUNTER — Encounter: Payer: Self-pay | Admitting: Family Medicine

## 2021-04-13 VITALS — BP 120/80 | HR 67 | Ht 67.0 in | Wt 165.0 lb

## 2021-04-13 DIAGNOSIS — M9902 Segmental and somatic dysfunction of thoracic region: Secondary | ICD-10-CM | POA: Diagnosis not present

## 2021-04-13 DIAGNOSIS — M9901 Segmental and somatic dysfunction of cervical region: Secondary | ICD-10-CM | POA: Diagnosis not present

## 2021-04-13 DIAGNOSIS — M9908 Segmental and somatic dysfunction of rib cage: Secondary | ICD-10-CM

## 2021-04-13 DIAGNOSIS — M940 Chondrocostal junction syndrome [Tietze]: Secondary | ICD-10-CM | POA: Diagnosis not present

## 2021-04-13 DIAGNOSIS — M9903 Segmental and somatic dysfunction of lumbar region: Secondary | ICD-10-CM | POA: Diagnosis not present

## 2021-04-13 DIAGNOSIS — N951 Menopausal and female climacteric states: Secondary | ICD-10-CM | POA: Diagnosis not present

## 2021-04-13 DIAGNOSIS — M9904 Segmental and somatic dysfunction of sacral region: Secondary | ICD-10-CM | POA: Diagnosis not present

## 2021-04-13 MED ORDER — GABAPENTIN 100 MG PO CAPS
200.0000 mg | ORAL_CAPSULE | Freq: Every day | ORAL | 0 refills | Status: DC
  Filled 2021-04-13: qty 180, 90d supply, fill #0

## 2021-04-13 NOTE — Patient Instructions (Signed)
[place] 200 mg Gabapentin 200 mg Keep being active See you again in 6-8 weeks

## 2021-04-13 NOTE — Assessment & Plan Note (Signed)
Chronic problem with exacerbation.  Patient was doing a lot more lifting.  He did do some traveling as well.  Discussed with patient to monitor for any type of side effects.  Gabapentin.  Follow-up again in 4 to 8 weeks.  Presented once again for Effexor.

## 2021-04-13 NOTE — Assessment & Plan Note (Signed)
Started gabapentin.  Warned of potential side effects.  Patient could be a candidate for Effexor as well.  Follow-up with me again 6 weeks

## 2021-04-19 ENCOUNTER — Ambulatory Visit
Admission: RE | Admit: 2021-04-19 | Discharge: 2021-04-19 | Disposition: A | Discharge status: Home / Self Care | Payer: 59 | Source: Ambulatory Visit | Attending: Obstetrics | Admitting: Obstetrics

## 2021-04-19 ENCOUNTER — Other Ambulatory Visit: Payer: Self-pay

## 2021-04-19 DIAGNOSIS — Z1231 Encounter for screening mammogram for malignant neoplasm of breast: Secondary | ICD-10-CM | POA: Diagnosis not present

## 2021-04-21 ENCOUNTER — Other Ambulatory Visit (HOSPITAL_COMMUNITY): Payer: Self-pay

## 2021-04-23 ENCOUNTER — Other Ambulatory Visit: Payer: Self-pay | Admitting: Obstetrics

## 2021-04-23 DIAGNOSIS — R928 Other abnormal and inconclusive findings on diagnostic imaging of breast: Secondary | ICD-10-CM

## 2021-05-10 ENCOUNTER — Other Ambulatory Visit: Payer: Self-pay

## 2021-05-10 ENCOUNTER — Ambulatory Visit
Admission: RE | Admit: 2021-05-10 | Discharge: 2021-05-10 | Disposition: A | Discharge status: Home / Self Care | Payer: 59 | Source: Ambulatory Visit | Attending: Obstetrics | Admitting: Obstetrics

## 2021-05-10 DIAGNOSIS — R928 Other abnormal and inconclusive findings on diagnostic imaging of breast: Secondary | ICD-10-CM

## 2021-05-10 DIAGNOSIS — R922 Inconclusive mammogram: Secondary | ICD-10-CM | POA: Diagnosis not present

## 2021-05-10 DIAGNOSIS — N6002 Solitary cyst of left breast: Secondary | ICD-10-CM | POA: Diagnosis not present

## 2021-06-02 ENCOUNTER — Other Ambulatory Visit (HOSPITAL_COMMUNITY): Payer: Self-pay

## 2021-06-02 DIAGNOSIS — Z8349 Family history of other endocrine, nutritional and metabolic diseases: Secondary | ICD-10-CM | POA: Diagnosis not present

## 2021-06-02 DIAGNOSIS — Z780 Asymptomatic menopausal state: Secondary | ICD-10-CM | POA: Diagnosis not present

## 2021-06-02 DIAGNOSIS — N951 Menopausal and female climacteric states: Secondary | ICD-10-CM | POA: Diagnosis not present

## 2021-06-02 MED ORDER — PROGESTERONE MICRONIZED 100 MG PO CAPS
100.0000 mg | ORAL_CAPSULE | Freq: Every day | ORAL | 0 refills | Status: DC
  Filled 2021-06-02 (×2): qty 90, 90d supply, fill #0

## 2021-06-02 MED ORDER — PROGESTERONE MICRONIZED 100 MG PO CAPS
100.0000 mg | ORAL_CAPSULE | Freq: Every day | ORAL | 0 refills | Status: DC
  Filled 2021-06-02: qty 90, 90d supply, fill #0

## 2021-06-02 MED ORDER — ESTRADIOL 0.1 MG/24HR TD PTTW
1.0000 | MEDICATED_PATCH | TRANSDERMAL | 2 refills | Status: DC
  Filled 2021-06-02: qty 24, 84d supply, fill #0
  Filled 2021-08-17: qty 24, 84d supply, fill #1
  Filled 2021-10-28: qty 24, 84d supply, fill #2

## 2021-06-10 ENCOUNTER — Other Ambulatory Visit (HOSPITAL_COMMUNITY): Payer: Self-pay

## 2021-06-10 ENCOUNTER — Other Ambulatory Visit: Payer: Self-pay | Admitting: Physician Assistant

## 2021-06-11 ENCOUNTER — Other Ambulatory Visit (HOSPITAL_COMMUNITY): Payer: Self-pay

## 2021-06-11 MED ORDER — VITAMIN D 25 MCG (1000 UNIT) PO TABS
1000.0000 [IU] | ORAL_TABLET | Freq: Every day | ORAL | 3 refills | Status: AC
  Filled 2021-06-11 – 2022-04-28 (×4): qty 90, 90d supply, fill #0

## 2021-06-15 ENCOUNTER — Encounter: Payer: Self-pay | Admitting: Physician Assistant

## 2021-06-15 DIAGNOSIS — O24419 Gestational diabetes mellitus in pregnancy, unspecified control: Secondary | ICD-10-CM

## 2021-06-15 MED ORDER — FREESTYLE LIBRE 14 DAY READER DEVI: 1.0000 | 0 refills | Status: DC

## 2021-06-15 MED ORDER — FREESTYLE LIBRE 3 SENSOR MISC: 1.0000 | 1 refills | Status: DC

## 2021-06-15 NOTE — Telephone Encounter (Signed)
Ok to code under history of gestational diabetes.

## 2021-06-18 DIAGNOSIS — H5212 Myopia, left eye: Secondary | ICD-10-CM | POA: Diagnosis not present

## 2021-08-17 ENCOUNTER — Other Ambulatory Visit (HOSPITAL_COMMUNITY): Payer: Self-pay

## 2021-08-29 ENCOUNTER — Other Ambulatory Visit (HOSPITAL_COMMUNITY): Payer: Self-pay

## 2021-08-30 ENCOUNTER — Other Ambulatory Visit (HOSPITAL_COMMUNITY): Payer: Self-pay

## 2021-08-30 MED ORDER — PROGESTERONE MICRONIZED 100 MG PO CAPS
100.0000 mg | ORAL_CAPSULE | Freq: Every day | ORAL | 1 refills | Status: DC
  Filled 2021-08-30: qty 90, 90d supply, fill #0
  Filled 2021-11-29: qty 90, 90d supply, fill #1

## 2021-09-20 IMAGING — DX DG PELVIS 1-2V
1 series · 1 of 1 positions shown · non-contrast
Comparison: None.

CLINICAL DATA: Chronic pain radiating to left hip

EXAM:
PELVIS - 1-2 VIEW

[pelvis ap]
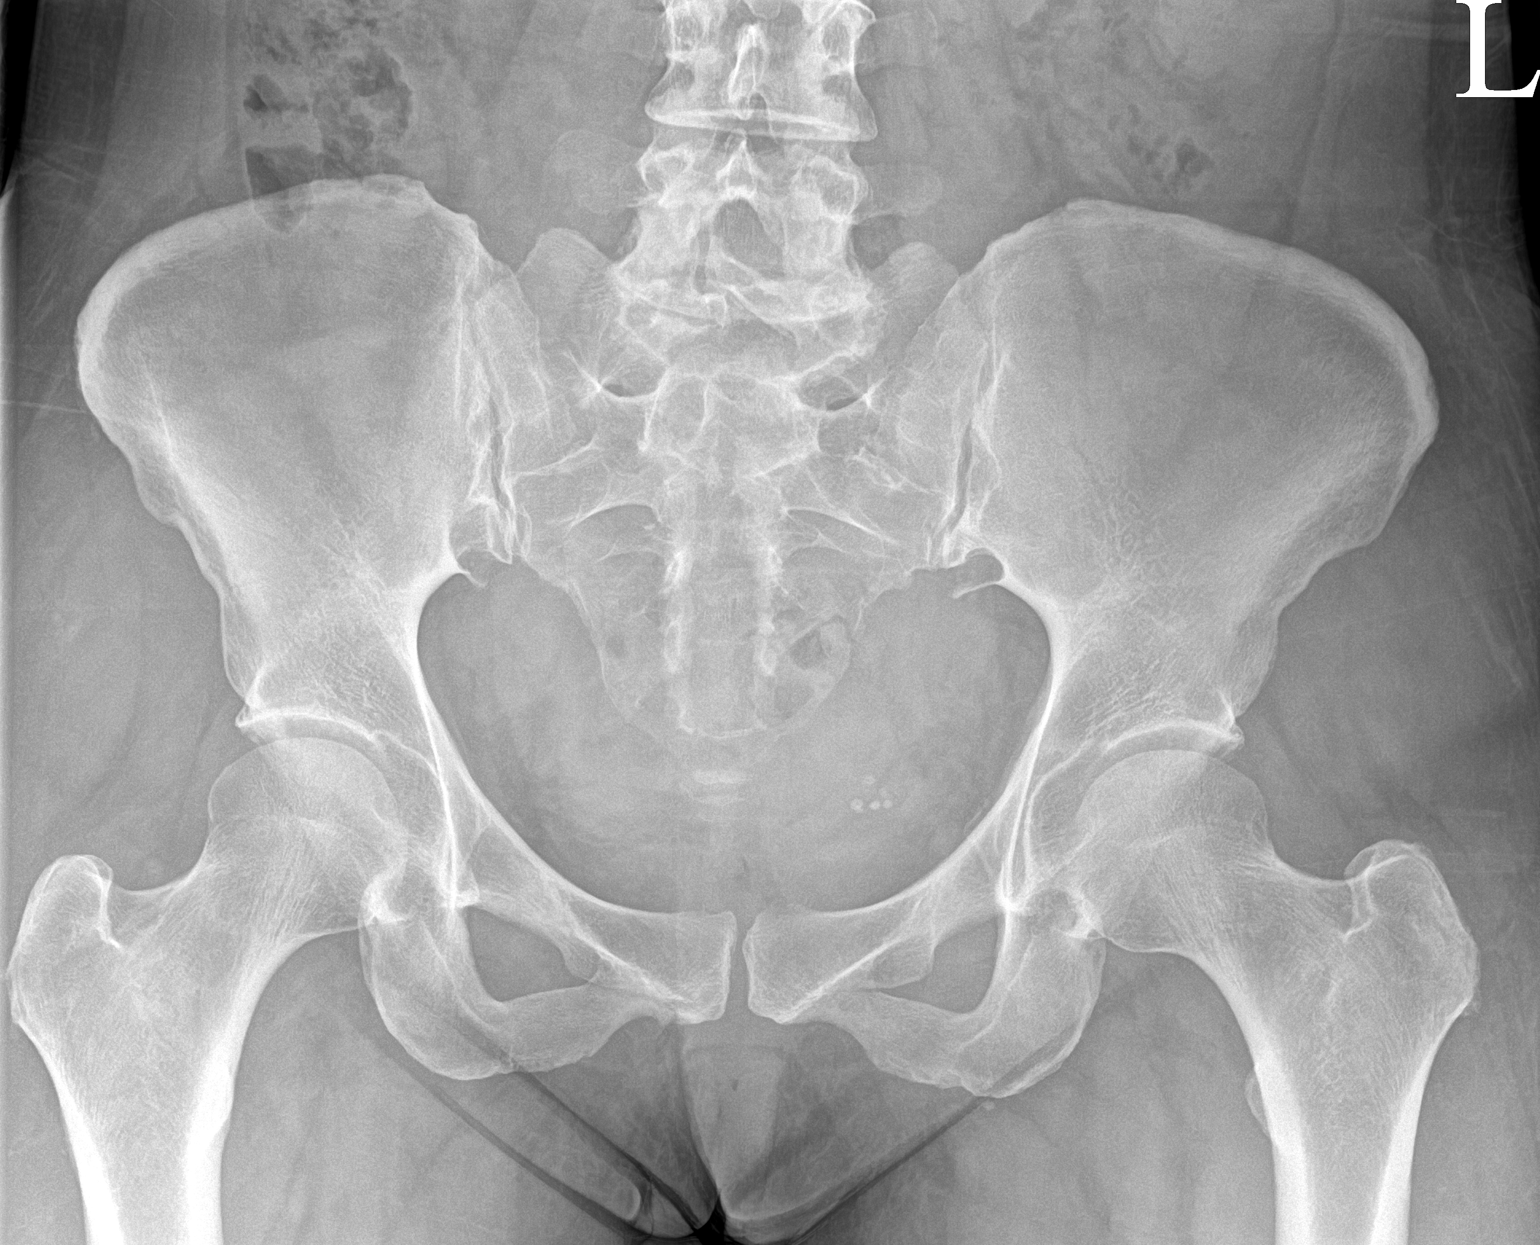

[1 of 1 positions shown; findings below may reference images not displayed]

FINDINGS: There is no evidence of pelvic fracture or diastasis. No pelvic bone
lesions are seen.
IMPRESSION: Negative.

## 2021-09-20 IMAGING — DX DG LUMBAR SPINE COMPLETE 4+V
5 series · 5 of 5 positions shown · non-contrast
Comparison: 03/09/2018

CLINICAL DATA: Chronic low back pain, left hip radiculopathy

EXAM:
LUMBAR SPINE - COMPLETE 4+ VIEW

[l-spine ap]
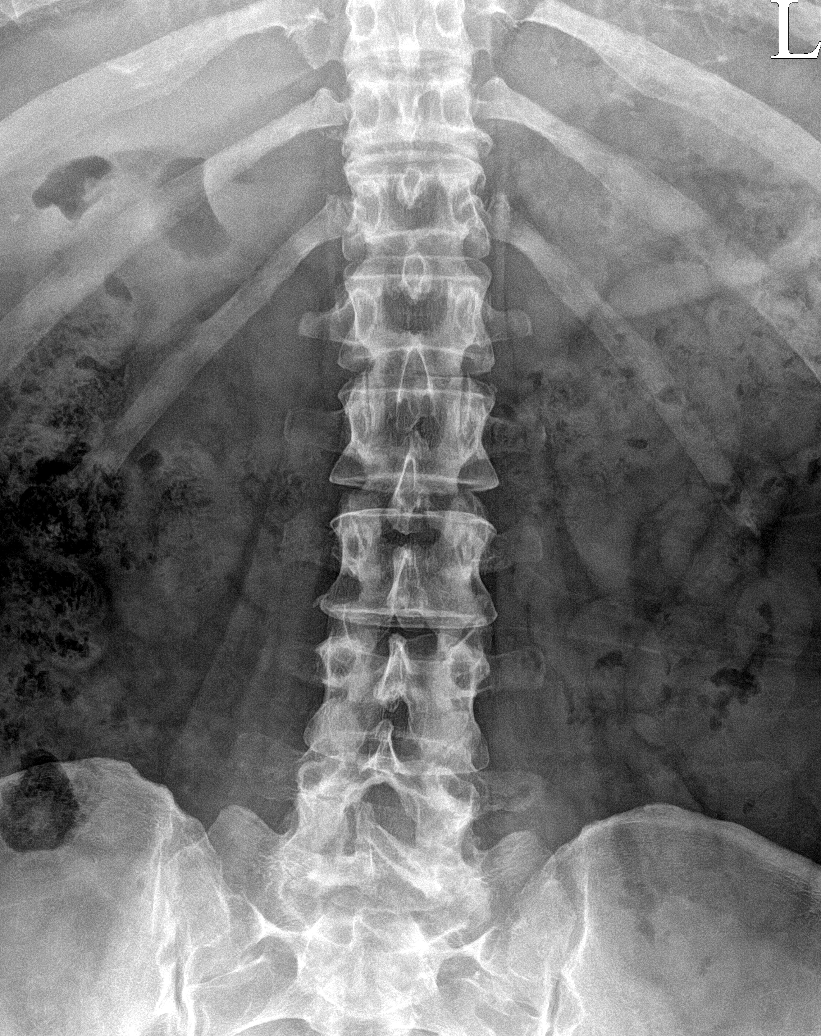

[l-spine obl (1 of 2)]
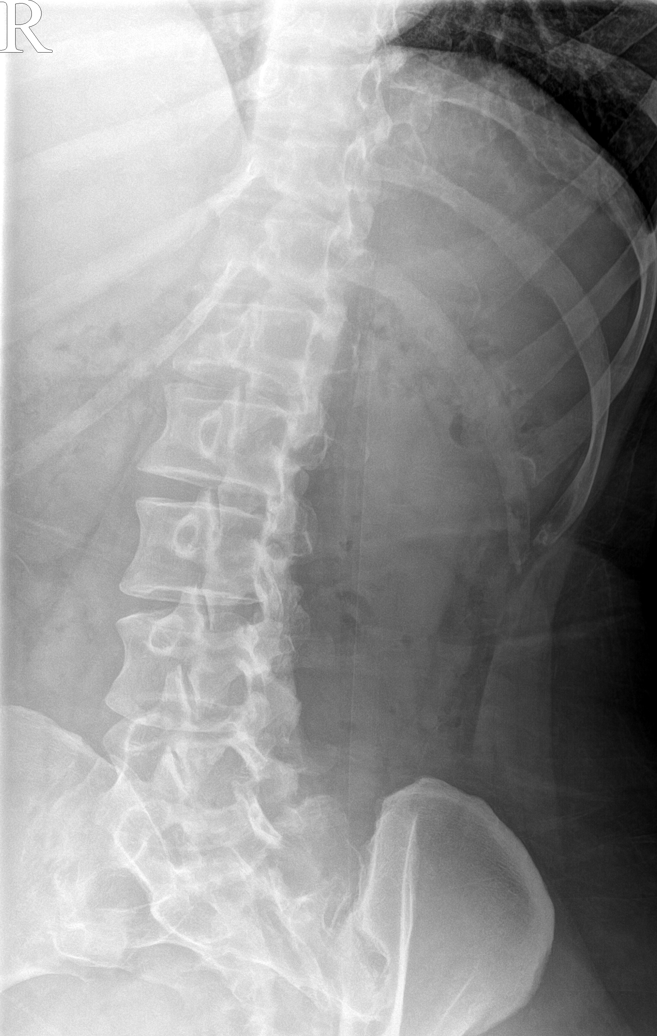

[l-spine obl (2 of 2)]
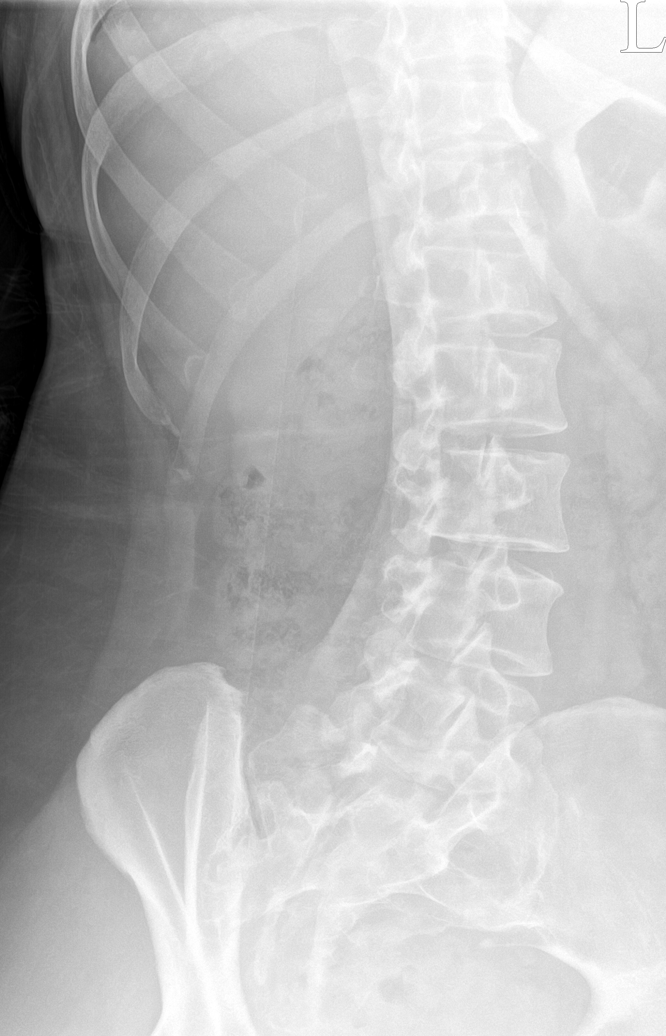

[l-spine lateral]
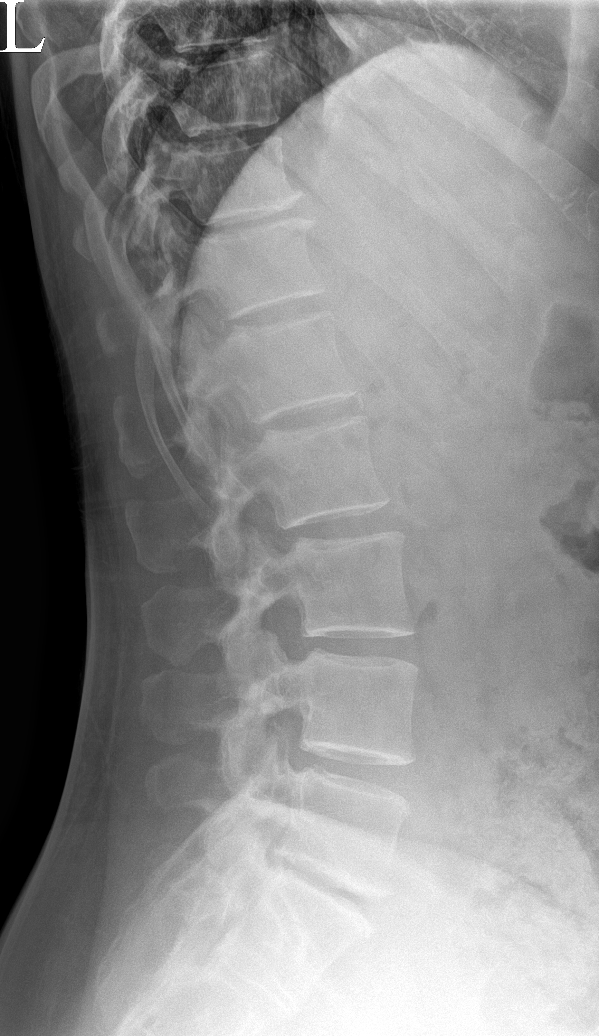

[l-spine spot]
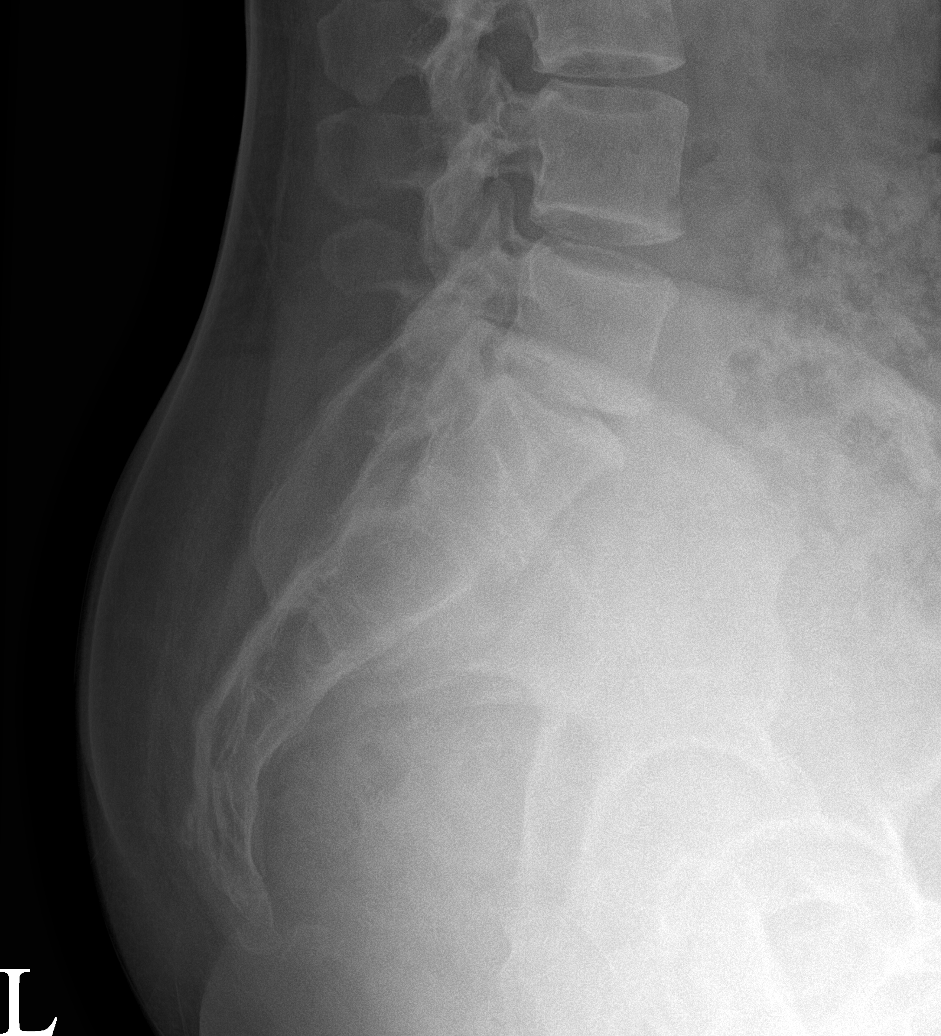

[5 of 5 positions shown; findings below may reference images not displayed]

FINDINGS: Frontal, bilateral oblique, and lateral views of the lumbar spine
are obtained. There are 5 non-rib-bearing lumbar type vertebral
bodies in anatomic alignment. No acute fractures. There is moderate
spondylosis at L5/S1, stable. Mild diffuse facet hypertrophy
greatest at L4-5 and L5-S1. Sacroiliac joints are normal.
IMPRESSION: 1. Stable lower lumbar spondylosis and facet hypertrophy. No acute
fracture.

## 2021-10-28 ENCOUNTER — Other Ambulatory Visit: Payer: Self-pay

## 2021-10-28 ENCOUNTER — Encounter: Payer: Self-pay | Admitting: Physician Assistant

## 2021-10-28 ENCOUNTER — Other Ambulatory Visit (HOSPITAL_COMMUNITY): Payer: Self-pay

## 2021-11-29 ENCOUNTER — Other Ambulatory Visit (HOSPITAL_COMMUNITY): Payer: Self-pay

## 2022-01-05 ENCOUNTER — Encounter: Payer: Self-pay | Admitting: Physician Assistant

## 2022-01-05 ENCOUNTER — Ambulatory Visit: Payer: 59 | Admitting: Physician Assistant

## 2022-01-05 ENCOUNTER — Other Ambulatory Visit (HOSPITAL_COMMUNITY): Payer: Self-pay

## 2022-01-05 VITALS — BP 116/72 | HR 60 | Ht 67.0 in | Wt 175.0 lb

## 2022-01-05 DIAGNOSIS — Z131 Encounter for screening for diabetes mellitus: Secondary | ICD-10-CM | POA: Diagnosis not present

## 2022-01-05 DIAGNOSIS — L989 Disorder of the skin and subcutaneous tissue, unspecified: Secondary | ICD-10-CM | POA: Diagnosis not present

## 2022-01-05 DIAGNOSIS — N951 Menopausal and female climacteric states: Secondary | ICD-10-CM | POA: Diagnosis not present

## 2022-01-05 DIAGNOSIS — Z13220 Encounter for screening for lipoid disorders: Secondary | ICD-10-CM

## 2022-01-05 DIAGNOSIS — R635 Abnormal weight gain: Secondary | ICD-10-CM | POA: Diagnosis not present

## 2022-01-05 DIAGNOSIS — Z1329 Encounter for screening for other suspected endocrine disorder: Secondary | ICD-10-CM

## 2022-01-05 DIAGNOSIS — E663 Overweight: Secondary | ICD-10-CM

## 2022-01-05 DIAGNOSIS — Z79899 Other long term (current) drug therapy: Secondary | ICD-10-CM

## 2022-01-05 MED ORDER — CLINDAMYCIN PHOSPHATE 1 % EX SOLN
1.0000 "application " | Freq: Two times a day (BID) | CUTANEOUS | 0 refills | Status: DC
  Filled 2022-01-05: qty 60, 30d supply, fill #0

## 2022-01-05 NOTE — Patient Instructions (Addendum)
[facility] bio identical hormones Wellbutrin SR twice a day to help  We could taper off estradiol and add effexor for hot flashes

## 2022-01-05 NOTE — Progress Notes (Signed)
Established Patient Office Visit  Subjective   Patient ID: [name], female    DOB: [birth year]  Age: 46 y.o. MRN: [id number]  Chief Complaint  Patient presents with   Menopause    HPI Pt is a 47 yo female concerned with 10lb weight gain over the last year since starting estradiol and prometrium. She feels so much better on this HRT but does not want to continue gaining weight. She is not on gabapentin. She does not want to go back to brain fog, hot flashes, insomnia, anxiety. Wonders what steps to take.   Pt has a skin lesion on her mid back bra-line slightly to the right about the size of a dime. She has noticed for the last 3 weeks. Started out as more of a pimple and when scratched off it is ulcerated. No draining. It is hurting. No itching. She has put bactroban and beta-iodine to clean area but not really helped. No fever, chills. She is fair skinned.   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 MI   Coronary artery disease Father        Age 49   Diabetes Father    Hyperlipidemia Maternal Aunt    Pulmonary fibrosis Maternal Aunt    Allergies  Allergen Reactions   Belviq [Lorcaserin Hcl]     Crazy dreams      ROS See HPI.    Objective:     BP 116/72   Pulse 60   Ht 5' 7"  (1.702 m)   Wt 175 lb (79.4 kg)   SpO2 99%   BMI 27.41 kg/m  BP Readings from Last 3 Encounters:  01/05/22 116/72  04/13/21 120/80  03/11/21 114/70      Physical Exam Vitals reviewed.  Constitutional:      Appearance: Normal appearance.  HENT:     Head: Normocephalic.  Cardiovascular:     Rate and Rhythm: Normal rate.  Pulmonary:     Effort: Pulmonary effort is normal.  Musculoskeletal:     Right lower leg: No edema.     Left lower leg: No edema.  Skin:    Comments: Mid back bra line to the right dime sized erythematous ulcerated lesion. No active discharge. Slightly tender to palpation.   Neurological:     General: No focal deficit present.     Mental Status: She is alert and  oriented to person, place, and time.  Psychiatric:        Mood and Affect: Mood normal.      Assessment & Plan:  Marland KitchenMarland KitchenDaphne was seen today for menopause.  Diagnoses and all orders for this visit:  Overweight (BMI 25.0-29.9) -     Estradiol -     Progesterone  Screening for diabetes mellitus -     COMPLETE METABOLIC PANEL WITH GFR  Screening for lipid disorders -     Lipid Panel w/reflex Direct LDL  Thyroid disorder screen -     TSH  Medication management -     TSH -     Lipid Panel w/reflex Direct LDL -     COMPLETE METABOLIC PANEL WITH GFR -     CBC with Differential/Platelet  Abnormal weight gain -     Estradiol -     Progesterone  Perimenopausal -     Estradiol -     Progesterone  Skin lesion of back -     clindamycin (CLEOCIN-T) 1 % external solution; Apply 1 application topically 2 (two) times daily for 7  days.   On HRT, will check estradiol. Consider tapering off HRT or at least estrogen Could see if other medications like effexor could help with any menopausal symptoms She could consider bio identical hormones at [facility] Consider adding wellbutrin to help with weight loss Consider wegovy as well for weight loss Continue exercise at least 150 minutes weekly  Screening labs ordered  Skin lesion is suspicious for [facility] since not healing and ulcerated Would like for dermatology to take a look and consider biopsy Already using bactroban start clindamycin ointment bid for next 7 days If cannot get in with dermatology consider coming back into office for biopsy   Iran Planas, PA-C

## 2022-01-06 DIAGNOSIS — D485 Neoplasm of uncertain behavior of skin: Secondary | ICD-10-CM | POA: Diagnosis not present

## 2022-01-06 DIAGNOSIS — L570 Actinic keratosis: Secondary | ICD-10-CM | POA: Diagnosis not present

## 2022-01-06 LAB — CBC WITH DIFFERENTIAL/PLATELET
Absolute Monocytes: 431 cells/uL (ref 200–950)
Basophils Absolute: 47 cells/uL (ref 0–200)
Basophils Relative: 0.8 %
Eosinophils Absolute: 112 cells/uL (ref 15–500)
Eosinophils Relative: 1.9 %
HCT: 43.3 % (ref 35.0–45.0)
Hemoglobin: 15 g/dL (ref 11.7–15.5)
Lymphs Abs: 1387 cells/uL (ref 850–3900)
MCH: 32 pg (ref 27.0–33.0)
MCHC: 34.6 g/dL (ref 32.0–36.0)
MCV: 92.3 fL (ref 80.0–100.0)
MPV: 11 fL (ref 7.5–12.5)
Monocytes Relative: 7.3 %
Neutro Abs: 3924 cells/uL (ref 1500–7800)
Neutrophils Relative %: 66.5 %
Platelets: 205 10*3/uL (ref 140–400)
RBC: 4.69 10*6/uL (ref 3.80–5.10)
RDW: 12.1 % (ref 11.0–15.0)
Total Lymphocyte: 23.5 %
WBC: 5.9 10*3/uL (ref 3.8–10.8)

## 2022-01-06 LAB — COMPLETE METABOLIC PANEL WITH GFR
AG Ratio: 1.8 (calc) (ref 1.0–2.5)
ALT: 13 U/L (ref 6–29)
AST: 14 U/L (ref 10–35)
Albumin: 4.3 g/dL (ref 3.6–5.1)
Alkaline phosphatase (APISO): 41 U/L (ref 31–125)
BUN: 17 mg/dL (ref 7–25)
CO2: 29 mmol/L (ref 20–32)
Calcium: 9.7 mg/dL (ref 8.6–10.2)
Chloride: 104 mmol/L (ref 98–110)
Creat: 0.78 mg/dL (ref 0.50–0.99)
Globulin: 2.4 g/dL (calc) (ref 1.9–3.7)
Glucose, Bld: 103 mg/dL (ref 65–139)
Potassium: 5 mmol/L (ref 3.5–5.3)
Sodium: 140 mmol/L (ref 135–146)
Total Bilirubin: 0.9 mg/dL (ref 0.2–1.2)
Total Protein: 6.7 g/dL (ref 6.1–8.1)
eGFR: 94 mL/min/{1.73_m2} (ref >=60)

## 2022-01-06 LAB — LIPID PANEL W/REFLEX DIRECT LDL
Cholesterol: 173 mg/dL (ref <200)
HDL: 77 mg/dL (ref >=50)
LDL Cholesterol (Calc): 81 mg/dL (calc)
Non-HDL Cholesterol (Calc): 96 mg/dL (calc) (ref <130)
Total CHOL/HDL Ratio: 2.2 (calc) (ref <5.0)
Triglycerides: 72 mg/dL (ref <150)

## 2022-01-06 LAB — TSH: TSH: 2.34 mIU/L

## 2022-01-06 LAB — PROGESTERONE: Progesterone: 5.8 ng/mL

## 2022-01-06 LAB — ESTRADIOL: Estradiol: 54 pg/mL

## 2022-01-06 NOTE — Progress Notes (Signed)
Thyroid looks great.  Cholesterol looks wonderful.  Kidney, liver, glucose look good.  Progesterone and estradiol are both on the low side of ranges.

## 2022-01-07 ENCOUNTER — Encounter: Payer: 59 | Admitting: Physician Assistant

## 2022-01-26 ENCOUNTER — Other Ambulatory Visit (HOSPITAL_COMMUNITY): Payer: Self-pay

## 2022-01-26 ENCOUNTER — Ambulatory Visit (INDEPENDENT_AMBULATORY_CARE_PROVIDER_SITE_OTHER): Payer: 59 | Admitting: Physician Assistant

## 2022-01-26 ENCOUNTER — Encounter: Payer: Self-pay | Admitting: Physician Assistant

## 2022-01-26 VITALS — BP 125/72 | HR 68 | Ht 67.0 in | Wt 176.0 lb

## 2022-01-26 DIAGNOSIS — R87810 Cervical high risk human papillomavirus (HPV) DNA test positive: Secondary | ICD-10-CM | POA: Diagnosis not present

## 2022-01-26 DIAGNOSIS — E663 Overweight: Secondary | ICD-10-CM

## 2022-01-26 DIAGNOSIS — Z0000 Encounter for general adult medical examination without abnormal findings: Secondary | ICD-10-CM

## 2022-01-26 DIAGNOSIS — Z124 Encounter for screening for malignant neoplasm of cervix: Secondary | ICD-10-CM | POA: Diagnosis not present

## 2022-01-26 DIAGNOSIS — Z01419 Encounter for gynecological examination (general) (routine) without abnormal findings: Secondary | ICD-10-CM | POA: Diagnosis not present

## 2022-01-26 DIAGNOSIS — Z6826 Body mass index (BMI) 26.0-26.9, adult: Secondary | ICD-10-CM | POA: Diagnosis not present

## 2022-01-26 DIAGNOSIS — Z01411 Encounter for gynecological examination (general) (routine) with abnormal findings: Secondary | ICD-10-CM | POA: Diagnosis not present

## 2022-01-26 DIAGNOSIS — N951 Menopausal and female climacteric states: Secondary | ICD-10-CM | POA: Diagnosis not present

## 2022-01-26 DIAGNOSIS — Z0142 Encounter for cervical smear to confirm findings of recent normal smear following initial abnormal smear: Secondary | ICD-10-CM | POA: Diagnosis not present

## 2022-01-26 DIAGNOSIS — N926 Irregular menstruation, unspecified: Secondary | ICD-10-CM | POA: Diagnosis not present

## 2022-01-26 DIAGNOSIS — Z113 Encounter for screening for infections with a predominantly sexual mode of transmission: Secondary | ICD-10-CM | POA: Diagnosis not present

## 2022-01-26 MED ORDER — FOLIC ACID 1 MG PO TABS
1.0000 mg | ORAL_TABLET | Freq: Every day | ORAL | 3 refills | Status: DC
Start: 2022-01-26 — End: 2024-04-02
  Filled 2022-01-26: qty 90, 90d supply, fill #0
  Filled 2022-04-28 – 2022-05-31 (×3): qty 90, 90d supply, fill #1
  Filled 2022-08-31: qty 90, 90d supply, fill #2
  Filled 2023-01-09: qty 90, 90d supply, fill #3

## 2022-01-26 MED ORDER — WEGOVY 1 MG/0.5ML ~~LOC~~ SOAJ
1.0000 mg | SUBCUTANEOUS | 0 refills | Status: DC
  Filled 2022-01-26 – 2022-08-09 (×2): qty 2, fill #0

## 2022-01-26 MED ORDER — PROGESTERONE MICRONIZED 100 MG PO CAPS
100.0000 mg | ORAL_CAPSULE | Freq: Every day | ORAL | 1 refills | Status: DC
  Filled 2022-01-26 – 2022-02-22 (×3): qty 90, 90d supply, fill #0

## 2022-01-26 MED ORDER — WEGOVY 0.5 MG/0.5ML ~~LOC~~ SOAJ
0.5000 mg | SUBCUTANEOUS | 0 refills | Status: DC
  Filled 2022-01-26: qty 2, fill #0
  Filled 2022-06-06 – 2022-08-09 (×2): qty 2, 28d supply, fill #0

## 2022-01-26 MED ORDER — ESTRADIOL 0.1 MG/24HR TD PTTW
1.0000 | MEDICATED_PATCH | TRANSDERMAL | 0 refills | Status: DC
  Filled 2022-01-26: qty 8, 28d supply, fill #0

## 2022-01-26 MED ORDER — WEGOVY 0.25 MG/0.5ML ~~LOC~~ SOAJ
0.2500 mg | SUBCUTANEOUS | 0 refills | Status: DC
  Filled 2022-01-26: qty 2, fill #0
  Filled 2022-06-06: qty 2, 28d supply, fill #0

## 2022-01-26 NOTE — Patient Instructions (Addendum)
De Quervain's Tenosynovitis  De Quervain's tenosynovitis is a condition that causes inflammation of the tendon on the thumb side of the wrist. Tendons are cords of tissue that connect bones to muscles. The tendons in the hand pass through a tunnel called a sheath. A slippery layer of tissue (synovium) lets the tendons move smoothly in the sheath. With de Quervain's tenosynovitis, the sheath swells or thickens, causing friction and pain. The condition is also called de Quervain's disease and de Quervain's syndrome. It occurs most often in women who are 48-48 years old. What are the causes? The exact cause of this condition is not known. It may be associated with overuse of the hand and wrist. What increases the risk? You are more likely to develop this condition if you: Use your hands far more than normal, especially if you repeat certain movements that involve twisting your hand or using a tight grip. Are pregnant. Are a middle-aged woman. Have rheumatoid arthritis. Have diabetes. What are the signs or symptoms? The main symptom of this condition is pain on the thumb side of the wrist. The pain may get worse when you grasp something or turn your wrist. Other symptoms may include: Pain that extends up the forearm. Swelling of your wrist and hand. Trouble moving the thumb and wrist. A sensation of snapping in the wrist. A bump filled with fluid (cyst) in the area of the pain. How is this diagnosed? This condition may be diagnosed based on: Your symptoms and medical history. A physical exam. During the exam, your health care provider may do a simple test Wynn Maudlin test) that involves pulling your thumb and wrist to see if this causes pain. You may also need to have an X-ray or ultrasound. How is this treated? Treatment for this condition may include: Avoiding any activity that causes pain and swelling. Taking medicines. Anti-inflammatory medicines and corticosteroid injections may be used  to reduce inflammation and relieve pain. Wearing a splint. Having surgery. This may be needed if other treatments do not work. Once the pain and swelling have gone down, you may start: Physical therapy. This includes exercises to improve movement and strength in your wrist and thumb. Occupational therapy. This includes adjusting how you move your wrist. Follow these instructions at home: If you have a splint: Wear the splint as told by your health care provider. Remove it only as told by your health care provider. Loosen the splint if your fingers tingle, become numb, or turn cold and blue. Keep the splint clean. If the splint is not waterproof: Do not let it get wet. Cover it with a watertight covering when you take a bath or a shower. Managing pain, stiffness, and swelling  Avoid movements and activities that cause pain and swelling in the wrist area. If directed, put ice on the painful area. This may be helpful after doing activities that involve the sore wrist. To do this: Put ice in a plastic bag. Place a towel between your skin and the bag. Leave the ice on for 20 minutes, 2-3 times a day. Remove the ice if your skin turns bright red. This is very important. If you cannot feel pain, heat, or cold, you have a greater risk of damage to the area. Move your fingers often to reduce stiffness and swelling. Raise (elevate) the injured area above the level of your heart while you are sitting or lying down. General instructions Return to your normal activities as told by your health care provider. Ask your  health care provider what activities are safe for you. Take over-the-counter and prescription medicines only as told by your health care provider. Keep all follow-up visits. This is important. Contact a health care provider if: Your pain medicine does not help. Your pain gets worse. You develop new symptoms. Summary De Quervain's tenosynovitis is a condition that causes inflammation  of the tendon on the thumb side of the wrist. The condition occurs most often in women who are 48-48 years old. The exact cause of this condition is not known. It may be associated with overuse of the hand and wrist. Treatment starts with avoiding activity that causes pain or swelling in the wrist area. Other treatments may include wearing a splint and taking medicine. Sometimes, surgery is needed. This information is not intended to replace advice given to you by your health care provider. Make sure you discuss any questions you have with your health care provider. Document Revised: 11/06/2019 Document Reviewed: 11/06/2019 Elsevier Patient Education  [place] Maintenance, Female Adopting a healthy lifestyle and getting preventive care are important in promoting health and wellness. Ask your health care provider about: The right schedule for you to have regular tests and exams. Things you can do on your own to prevent diseases and keep yourself healthy. What should I know about diet, weight, and exercise? Eat a healthy diet  Eat a diet that includes plenty of vegetables, fruits, low-fat dairy products, and lean protein. Do not eat a lot of foods that are high in solid fats, added sugars, or sodium. Maintain a healthy weight Body mass index (BMI) is used to identify weight problems. It estimates body fat based on height and weight. Your health care provider can help determine your BMI and help you achieve or maintain a healthy weight. Get regular exercise Get regular exercise. This is one of the most important things you can do for your health. Most adults should: Exercise for at least 150 minutes each week. The exercise should increase your heart rate and make you sweat (moderate-intensity exercise). Do strengthening exercises at least twice a week. This is in addition to the moderate-intensity exercise. Spend less time sitting. Even light physical activity can be  beneficial. Watch cholesterol and blood lipids Have your blood tested for lipids and cholesterol at 48 years of age, then have this test every 5 years. Have your cholesterol levels checked more often if: Your lipid or cholesterol levels are high. You are older than 47 years of age. You are at high risk for heart disease. What should I know about cancer screening? Depending on your health history and family history, you may need to have cancer screening at various ages. This may include screening for: Breast cancer. Cervical cancer. Colorectal cancer. Skin cancer. Lung cancer. What should I know about heart disease, diabetes, and high blood pressure? Blood pressure and heart disease High blood pressure causes heart disease and increases the risk of stroke. This is more likely to develop in people who have high blood pressure readings or are overweight. Have your blood pressure checked: Every 3-5 years if you are 38-77 years of age. Every year if you are 51 years old or older. Diabetes Have regular diabetes screenings. This checks your fasting blood sugar level. Have the screening done: Once every three years after age 77 if you are at a normal weight and have a low risk for diabetes. More often and at a younger age if you are overweight or have a  high risk for diabetes. What should I know about preventing infection? Hepatitis B If you have a higher risk for hepatitis B, you should be screened for this virus. Talk with your health care provider to find out if you are at risk for hepatitis B infection. Hepatitis C Testing is recommended for: Everyone born from [birth year] through 1967. Anyone with known risk factors for hepatitis C. Sexually transmitted infections (STIs) Get screened for STIs, including gonorrhea and chlamydia, if: You are sexually active and are younger than 47 years of age. You are older than 48 years of age and your health care provider tells you that you are at risk for  this type of infection. Your sexual activity has changed since you were last screened, and you are at increased risk for chlamydia or gonorrhea. Ask your health care provider if you are at risk. Ask your health care provider about whether you are at high risk for HIV. Your health care provider may recommend a prescription medicine to help prevent HIV infection. If you choose to take medicine to prevent HIV, you should first get tested for HIV. You should then be tested every 3 months for as long as you are taking the medicine. Pregnancy If you are about to stop having your period (premenopausal) and you may become pregnant, seek counseling before you get pregnant. Take 400 to 800 micrograms (mcg) of folic acid every day if you become pregnant. Ask for birth control (contraception) if you want to prevent pregnancy. Osteoporosis and menopause Osteoporosis is a disease in which the bones lose minerals and strength with aging. This can result in bone fractures. If you are 46 years old or older, or if you are at risk for osteoporosis and fractures, ask your health care provider if you should: Be screened for bone loss. Take a calcium or vitamin D supplement to lower your risk of fractures. Be given hormone replacement therapy (HRT) to treat symptoms of menopause. Follow these instructions at home: Alcohol use Do not drink alcohol if: Your health care provider tells you not to drink. You are pregnant, may be pregnant, or are planning to become pregnant. If you drink alcohol: Limit how much you have to: 0-1 drink a day. Know how much alcohol is in your drink. In the [place] one drink equals one 12 oz bottle of beer (355 mL), one 5 oz glass of wine (148 mL), or one 1 oz glass of hard liquor (44 mL). Lifestyle Do not use any products that contain nicotine or tobacco. These products include cigarettes, chewing tobacco, and vaping devices, such as e-cigarettes. If you need help quitting, ask your health  care provider. Do not use street drugs. Do not share needles. Ask your health care provider for help if you need support or information about quitting drugs. General instructions Schedule regular health, dental, and eye exams. Stay current with your vaccines. Tell your health care provider if: You often feel depressed. You have ever been abused or do not feel safe at home. Summary Adopting a healthy lifestyle and getting preventive care are important in promoting health and wellness. Follow your health care provider's instructions about healthy diet, exercising, and getting tested or screened for diseases. Follow your health care provider's instructions on monitoring your cholesterol and blood pressure. This information is not intended to replace advice given to you by your health care provider. Make sure you discuss any questions you have with your health care provider. Document Revised: 12/14/2020 Document Reviewed: 12/14/2020 Elsevier Patient  Education  [place].

## 2022-01-26 NOTE — Progress Notes (Incomplete)
Complete physical exam  Patient: GWENDOLYN MCLEES   DOB: July 02, 1976   46 y.o. Female  MRN: [id number]  Subjective:    Chief Complaint  Patient presents with  . Annual Exam    DEARIA WILMOUTH is a 46 y.o. female who presents today for a complete physical exam. She reports consuming a {diet types:17450} diet. {types:19826} She generally feels {DESC; WELL/FAIRLY WELL/POORLY:18703}. She reports sleeping {DESC; WELL/FAIRLY WELL/POORLY:18703}. She {does/does not:200015} have additional problems to discuss today.    Most recent fall risk assessment:    01/05/2022    9:00 AM  [place] in the past year? 0  Number falls in past yr: 0  Injury with Fall? 0  Risk for fall due to : No Fall Risks  Follow up Falls evaluation completed     Most recent depression screenings:    01/05/2022    9:00 AM 01/25/2021    3:22 PM  PHQ 2/9 Scores  PHQ - 2 Score 0 0    {VISON DENTAL STD PSA (Optional):27386}  {History (Optional):23778}  Patient Care Team: Lavada Mesi as PCP - General (Family Medicine)   Outpatient Medications Prior to Visit  Medication Sig  . Biotin 1 MG CAPS Take by mouth daily.  . cholecalciferol (VITAMIN D3) 25 MCG (1000 UNIT) tablet Take 1 tablet (1,000 Units total) by mouth daily.  [name] estradiol (MINIVELLE) 0.1 MG/24HR patch Place 1 patch (0.1 mg total) onto the skin 2 (two) times a week.  . folic acid (FOLVITE) 1 MG tablet Take 1 tablet (1 mg total) by mouth daily.  [name] ibuprofen (ADVIL) 200 MG tablet Take 200 mg by mouth every 6 (six) hours as needed.  . progesterone (PROMETRIUM) 100 MG capsule Take 1 capsule (100 mg total) by mouth at bedtime.  . [DISCONTINUED] Probiotic Product (PROBIOTIC PO) Take by mouth.  . [DISCONTINUED] Probiotic, Lactobacillus, CAPS SMARTSIG:1 Capsule(s) By Mouth  . [DISCONTINUED] triamcinolone cream (KENALOG) 0.1 % Apply 1 Application topically 2 (two) times daily.  . [DISCONTINUED] Turmeric 400 MG CAPS Take by mouth.  .  [DISCONTINUED] clindamycin (CLEOCIN-T) 1 % external solution Apply 1 application topically 2 (two) times daily for 7 days.   No facility-administered medications prior to visit.    ROS        Objective:     BP 125/72   Pulse 68   Ht 5' 7"  (1.702 m)   Wt 176 lb (79.8 kg)   SpO2 96%   BMI 27.57 kg/m  BP Readings from Last 3 Encounters:  01/26/22 125/72  01/05/22 116/72  04/13/21 120/80   Wt Readings from Last 3 Encounters:  01/26/22 176 lb (79.8 kg)  01/05/22 175 lb (79.4 kg)  04/13/21 165 lb (74.8 kg)      Physical Exam      Assessment & Plan:    Routine Health Maintenance and Physical Exam  Immunization History  Administered Date(s) Administered  . Influenza,inj,Quad PF,6+ Mos 05/22/2020  . Influenza-Unspecified 05/17/2014, 05/15/2015, 05/22/2018, 05/17/2021  . PFIZER(Purple Top)SARS-COV-2 Vaccination 08/27/2019, 09/17/2019, 05/08/2020    Health Maintenance  Topic Date Due  . COLONOSCOPY (Pts 45-35yr Insurance coverage will need to be confirmed)  Never done  . COVID-19 Vaccine (4 - Booster for [place]) 07/03/2020  . TETANUS/TDAP  01/06/2023 (Originally 08/08/2020)  . INFLUENZA VACCINE  03/08/2022  . MAMMOGRAM  04/19/2022  . PAP SMEAR-Modifier  01/06/2024  . Hepatitis C Screening  Completed  . HIV Screening  Completed  . HPV  VACCINES  Aged Out    Discussed health benefits of physical activity, and encouraged her to engage in regular exercise appropriate for her age and condition.    No follow-ups on file.     Iran Planas, PA-C

## 2022-01-31 ENCOUNTER — Other Ambulatory Visit (HOSPITAL_COMMUNITY): Payer: Self-pay

## 2022-02-01 ENCOUNTER — Encounter: Payer: Self-pay | Admitting: Physician Assistant

## 2022-02-04 DIAGNOSIS — L570 Actinic keratosis: Secondary | ICD-10-CM | POA: Diagnosis not present

## 2022-02-22 ENCOUNTER — Other Ambulatory Visit (HOSPITAL_COMMUNITY): Payer: Self-pay

## 2022-02-22 DIAGNOSIS — D2272 Melanocytic nevi of left lower limb, including hip: Secondary | ICD-10-CM | POA: Diagnosis not present

## 2022-02-22 DIAGNOSIS — D2271 Melanocytic nevi of right lower limb, including hip: Secondary | ICD-10-CM | POA: Diagnosis not present

## 2022-02-22 DIAGNOSIS — N951 Menopausal and female climacteric states: Secondary | ICD-10-CM | POA: Diagnosis not present

## 2022-02-22 DIAGNOSIS — L821 Other seborrheic keratosis: Secondary | ICD-10-CM | POA: Diagnosis not present

## 2022-02-22 DIAGNOSIS — D2261 Melanocytic nevi of right upper limb, including shoulder: Secondary | ICD-10-CM | POA: Diagnosis not present

## 2022-02-22 DIAGNOSIS — L578 Other skin changes due to chronic exposure to nonionizing radiation: Secondary | ICD-10-CM | POA: Diagnosis not present

## 2022-02-22 DIAGNOSIS — L814 Other melanin hyperpigmentation: Secondary | ICD-10-CM | POA: Diagnosis not present

## 2022-02-22 DIAGNOSIS — R87619 Unspecified abnormal cytological findings in specimens from cervix uteri: Secondary | ICD-10-CM | POA: Diagnosis not present

## 2022-02-22 DIAGNOSIS — N950 Postmenopausal bleeding: Secondary | ICD-10-CM | POA: Diagnosis not present

## 2022-02-22 DIAGNOSIS — D225 Melanocytic nevi of trunk: Secondary | ICD-10-CM | POA: Diagnosis not present

## 2022-02-22 DIAGNOSIS — L570 Actinic keratosis: Secondary | ICD-10-CM | POA: Diagnosis not present

## 2022-02-22 MED ORDER — FLUOROURACIL 5 % EX CREA
1.0000 | TOPICAL_CREAM | Freq: Two times a day (BID) | CUTANEOUS | 2 refills | Status: DC
Start: 2022-02-22 — End: 2024-04-02
  Filled 2022-02-22: qty 40, 30d supply, fill #0

## 2022-02-22 MED ORDER — ESTRADIOL 0.1 MG/24HR TD PTTW
1.0000 | MEDICATED_PATCH | TRANSDERMAL | 3 refills | Status: DC
  Filled 2022-02-22: qty 24, 84d supply, fill #0
  Filled 2022-08-09: qty 24, 84d supply, fill #1
  Filled 2022-10-20 (×2): qty 24, 84d supply, fill #2

## 2022-02-22 MED ORDER — PROGESTERONE MICRONIZED 100 MG PO CAPS
100.0000 mg | ORAL_CAPSULE | Freq: Every day | ORAL | 3 refills | Status: DC
  Filled 2022-04-28 – 2022-05-31 (×3): qty 90, 90d supply, fill #0
  Filled 2022-08-31: qty 90, 90d supply, fill #1
  Filled 2022-12-01: qty 90, 90d supply, fill #2

## 2022-02-23 ENCOUNTER — Other Ambulatory Visit (HOSPITAL_COMMUNITY): Payer: Self-pay

## 2022-02-23 MED ORDER — ESTRADIOL 0.1 MG/24HR TD PTTW
1.0000 | MEDICATED_PATCH | TRANSDERMAL | 2 refills | Status: DC
  Filled 2022-04-28 – 2022-05-16 (×2): qty 24, 84d supply, fill #0
  Filled 2022-07-21 – 2022-10-20 (×3): qty 24, 84d supply, fill #1
  Filled 2023-01-09: qty 24, 84d supply, fill #2

## 2022-03-07 ENCOUNTER — Ambulatory Visit (INDEPENDENT_AMBULATORY_CARE_PROVIDER_SITE_OTHER): Payer: 59 | Admitting: Family Medicine

## 2022-03-07 ENCOUNTER — Other Ambulatory Visit (HOSPITAL_COMMUNITY): Payer: Self-pay

## 2022-03-07 VITALS — BP 112/76 | HR 65 | Ht 67.0 in | Wt 177.0 lb

## 2022-03-07 DIAGNOSIS — M999 Biomechanical lesion, unspecified: Secondary | ICD-10-CM | POA: Diagnosis not present

## 2022-03-07 DIAGNOSIS — M9902 Segmental and somatic dysfunction of thoracic region: Secondary | ICD-10-CM

## 2022-03-07 DIAGNOSIS — M9904 Segmental and somatic dysfunction of sacral region: Secondary | ICD-10-CM

## 2022-03-07 DIAGNOSIS — M9903 Segmental and somatic dysfunction of lumbar region: Secondary | ICD-10-CM

## 2022-03-07 DIAGNOSIS — M76891 Other specified enthesopathies of right lower limb, excluding foot: Secondary | ICD-10-CM | POA: Diagnosis not present

## 2022-03-07 MED ORDER — TIZANIDINE HCL 4 MG PO TABS
4.0000 mg | ORAL_TABLET | Freq: Every evening | ORAL | 0 refills | Status: DC | PRN
  Filled 2022-03-07: qty 30, 30d supply, fill #0

## 2022-03-07 MED ORDER — MELOXICAM 7.5 MG PO TABS
7.5000 mg | ORAL_TABLET | Freq: Every day | ORAL | 0 refills | Status: DC
  Filled 2022-03-07: qty 30, 30d supply, fill #0

## 2022-03-07 NOTE — Patient Instructions (Addendum)
Good to see you! Zanaflex 4 mg for 3 nights then PRN Meloxicam 7.'5mg'$  for 10 days then PRN  Consider cocktail if pain is on going See you again in 4-6 weeks

## 2022-03-07 NOTE — Assessment & Plan Note (Signed)
   Decision today to treat with OMT was based on Physical Exam  After verbal consent was treated with  ME, FPR techniques in , thoracic, lumbar and sacral areas, all areas are chronic   Patient tolerated the procedure well with improvement in symptoms  Patient given exercises, stretches and lifestyle modifications  See medications in patient instructions if given  Patient will follow up in 4-8 weeks

## 2022-03-07 NOTE — Assessment & Plan Note (Signed)
Chronic problem with exacerbation.  Seems to be significantly tighter than usual.  Patient does have some loss of lordosis of the back.  Patient does have a more tightness of the hip flexor that makes it concerning.  Has had meloxicam previously and refilled at the moment.  Responded relatively well to injections.  If worsening symptoms will need to consider the possibility of Toradol and Depo-Medrol.  Hold on advanced imaging at this time follow-up again in 4 to 5 weeks

## 2022-03-07 NOTE — Progress Notes (Signed)
Zach Rosalva Neary Matthews [address] [place] Phone: [phone] Subjective:   IVilma Meckel, am serving as a scribe for Dr. Hulan Saas.  I'm seeing this patient by the request  of:  Donella Stade, PA-C  CC:   [id number]  OMT in sept 2022.  LENE MCKAY is a 46 y.o. female coming in with complaint of LBP. Back was tight thi past weekend. Sunday she was cleaning and bent over and felt a catch. Heat and ibuprofen when it happened. No other complaints.       Past Medical History:  Diagnosis Date   Gestational diabetes    Heart murmur    during pregnancy   Preeclampsia    Past Surgical History:  Procedure Laterality Date   CESAREAN SECTION     DILATION AND EVACUATION  04/23/2011   Procedure: DILATATION AND EVACUATION (D&E);  Surgeon: Floyce Stakes. Fogleman;  Location: [facility];  Service: Gynecology;  Laterality: N/A;  Ultrasound Guidance   laser iridotomy Bilateral May 2015   Social History   Socioeconomic History   Marital status: Divorced    Spouse name: Not on file   Number of children: 1   Years of education: Not on file   Highest education level: Not on file  Occupational History    Employer: [employer]  Tobacco Use   Smoking status: Never   Smokeless tobacco: Never  Vaping Use   Vaping Use: Never used  Substance and Sexual Activity   Alcohol use: Yes    Comment: occasionally   Drug use: No   Sexual activity: Not on file  Other Topics Concern   Not on file  Social History Narrative   Not on file   Social Determinants of Health   Financial Resource Strain: Not on file  Food Insecurity: Not on file  Transportation Needs: Not on file  Physical Activity: Not on file  Stress: Not on file  Social Connections: Not on file   Allergies  Allergen Reactions   Belviq [Lorcaserin Hcl]     Crazy dreams   Family History  Problem Relation Age of Onset   Sudden death Mother        Age 16; ? MI   Coronary artery  disease Father        Age 34   Diabetes Father    Hyperlipidemia Maternal Aunt    Pulmonary fibrosis Maternal Aunt     Current Outpatient Medications (Endocrine & Metabolic):    estradiol (MINIVELLE) 0.1 MG/24HR patch, Place 1 patch (0.1 mg total) onto the skin 2 (two) times a week.   estradiol (MINIVELLE) 0.1 MG/24HR patch, Place 1 patch (0.1 mg total) onto the skin 2 (two) times a week.   estradiol (MINIVELLE) 0.1 MG/24HR patch, Place 1 patch (0.1 mg total) onto the skin 2 (two) times a week.   estradiol (VIVELLE-DOT) 0.1 MG/24HR patch, Place 1 patch (0.1 mg total) onto the skin 2 (two) times a week.   progesterone (PROMETRIUM) 100 MG capsule, Take 1 capsule (100 mg total) by mouth at bedtime.   progesterone (PROMETRIUM) 100 MG capsule, Take 1 capsule (100 mg total) by mouth at bedtime.    Current Outpatient Medications (Analgesics):    meloxicam (MOBIC) 7.5 MG tablet, Take 1 tablet (7.5 mg total) by mouth daily.   ibuprofen (ADVIL) 200 MG tablet, Take 200 mg by mouth every 6 (six) hours as needed.  Current Outpatient Medications (Hematological):    folic acid (FOLVITE)  1 MG tablet, Take 1 tablet (1 mg total) by mouth daily.   folic acid (FOLVITE) 1 MG tablet, Take 1 tablet (1 mg total) by mouth daily.  Current Outpatient Medications (Other):    tiZANidine (ZANAFLEX) 4 MG tablet, Take 1 tablet (4 mg total) by mouth at bedtime as needed for muscle spasms.   Alum & Mag Hydroxide-Simeth (MAG-AL PLUS PO), Take by mouth.   Biotin 1 MG CAPS, Take by mouth daily.   cholecalciferol (VITAMIN D3) 25 MCG (1000 UNIT) tablet, Take 1 tablet (1,000 Units total) by mouth daily.   fluorouracil (EFUDEX) 5 % cream, Apply 1 application liberally to affected area twice a day ; apply to lower legs in the fall or winter   WEGOVY 0.25 MG/0.5ML SOAJ, Inject 0.25 mg into the skin once a week. Use this dose for 1 month (4 shots) and then increase to next higher dose.   WEGOVY 0.5 MG/0.5ML SOAJ, Inject 0.5 mg  into the skin once a week. Use this dose for 1 month (4 shots) and then increase to next higher dose.   [START ON 03/28/2022] WEGOVY 1 MG/0.5ML SOAJ, Inject 1 mg into the skin once a week. Use this dose for 1 month (4 shots) and then increase to next higher dose.   Reviewed prior external information including notes and imaging from  primary care provider As well as notes that were available from care everywhere and other healthcare systems.  Past medical history, social, surgical and family history all reviewed in electronic medical record.  No pertanent information unless stated regarding to the chief complaint.   Review of Systems:  No headache, visual changes, nausea, vomiting, diarrhea, constipation, dizziness, abdominal pain, skin rash, fevers, chills, night sweats, weight loss, swollen lymph nodes, body aches, joint swelling, chest pain, shortness of breath, mood changes. POSITIVE muscle aches  Objective  Blood pressure 112/76, pulse 65, height '5\' 7"'$  (1.702 m), weight 177 lb (80.3 kg), SpO2 96 %.   General: No apparent distress alert and oriented x3 mood and affect normal, dressed appropriately.  HEENT: Pupils equal, extraocular movements intact  Respiratory: Patient's speak in full sentences and does not appear short of breath  Cardiovascular: No lower extremity edema, non tender, no erythema  Low back exam shows significant tightness noted in the paraspinal musculature.  Seems to be more on the right hip flexor.  Patient does have more of a spasm noted.  Difficulty with straightening up on the way.  Negative straight leg test.  5 out of 5 strength in lower extremity  Osteopathic findings  T9 extended rotated and side bent right L2 flexed rotated and side bent right Sacrum right on right     Impression and Recommendations:     The above documentation has been reviewed and is accurate and complete Lyndal Pulley, DO

## 2022-03-09 ENCOUNTER — Encounter: Payer: Self-pay | Admitting: Neurology

## 2022-04-28 ENCOUNTER — Other Ambulatory Visit (HOSPITAL_COMMUNITY): Payer: Self-pay

## 2022-04-28 ENCOUNTER — Other Ambulatory Visit: Payer: Self-pay | Admitting: Physician Assistant

## 2022-04-29 ENCOUNTER — Other Ambulatory Visit (HOSPITAL_COMMUNITY): Payer: Self-pay

## 2022-04-29 MED ORDER — BIOTIN 1 MG PO CAPS
1.0000 | ORAL_CAPSULE | Freq: Every day | ORAL | 99 refills | Status: AC
  Filled 2022-04-29 – 2022-08-09 (×4): qty 30, fill #0
  Filled 2022-08-31: qty 30, 30d supply, fill #0
  Filled 2022-10-20: qty 30, fill #0

## 2022-05-10 ENCOUNTER — Other Ambulatory Visit (HOSPITAL_COMMUNITY): Payer: Self-pay

## 2022-05-16 ENCOUNTER — Other Ambulatory Visit (HOSPITAL_COMMUNITY): Payer: Self-pay

## 2022-05-17 ENCOUNTER — Other Ambulatory Visit (HOSPITAL_COMMUNITY): Payer: Self-pay

## 2022-05-26 ENCOUNTER — Other Ambulatory Visit (HOSPITAL_COMMUNITY): Payer: Self-pay

## 2022-05-31 ENCOUNTER — Other Ambulatory Visit (HOSPITAL_BASED_OUTPATIENT_CLINIC_OR_DEPARTMENT_OTHER): Payer: Self-pay

## 2022-05-31 ENCOUNTER — Other Ambulatory Visit (HOSPITAL_COMMUNITY): Payer: Self-pay

## 2022-06-06 ENCOUNTER — Other Ambulatory Visit (HOSPITAL_BASED_OUTPATIENT_CLINIC_OR_DEPARTMENT_OTHER): Payer: Self-pay

## 2022-06-06 ENCOUNTER — Encounter: Payer: Self-pay | Admitting: Physician Assistant

## 2022-06-13 ENCOUNTER — Other Ambulatory Visit: Payer: Self-pay | Admitting: Obstetrics

## 2022-06-13 ENCOUNTER — Other Ambulatory Visit (HOSPITAL_BASED_OUTPATIENT_CLINIC_OR_DEPARTMENT_OTHER): Payer: Self-pay

## 2022-06-13 DIAGNOSIS — Z1231 Encounter for screening mammogram for malignant neoplasm of breast: Secondary | ICD-10-CM

## 2022-06-14 ENCOUNTER — Other Ambulatory Visit (HOSPITAL_BASED_OUTPATIENT_CLINIC_OR_DEPARTMENT_OTHER): Payer: Self-pay

## 2022-06-17 ENCOUNTER — Other Ambulatory Visit (HOSPITAL_BASED_OUTPATIENT_CLINIC_OR_DEPARTMENT_OTHER): Payer: Self-pay

## 2022-07-06 DIAGNOSIS — H5203 Hypermetropia, bilateral: Secondary | ICD-10-CM | POA: Diagnosis not present

## 2022-07-11 ENCOUNTER — Encounter: Payer: Self-pay | Admitting: Physician Assistant

## 2022-07-26 ENCOUNTER — Other Ambulatory Visit: Payer: Self-pay

## 2022-08-09 ENCOUNTER — Other Ambulatory Visit (HOSPITAL_COMMUNITY): Payer: Self-pay

## 2022-08-10 ENCOUNTER — Other Ambulatory Visit: Payer: Self-pay

## 2022-08-12 ENCOUNTER — Ambulatory Visit
Admission: RE | Admit: 2022-08-12 | Discharge: 2022-08-12 | Disposition: A | Discharge status: Home / Self Care | Payer: Commercial Managed Care - PPO | Source: Ambulatory Visit | Attending: Obstetrics | Admitting: Obstetrics

## 2022-08-12 DIAGNOSIS — Z1231 Encounter for screening mammogram for malignant neoplasm of breast: Secondary | ICD-10-CM | POA: Diagnosis not present

## 2022-08-15 ENCOUNTER — Ambulatory Visit: Payer: Commercial Managed Care - PPO | Admitting: Podiatry

## 2022-08-15 DIAGNOSIS — L600 Ingrowing nail: Secondary | ICD-10-CM | POA: Diagnosis not present

## 2022-08-15 NOTE — Progress Notes (Signed)
Subjective:   Patient ID: [name], female   DOB: 47 y.o.   MRN: [id number]   HPI Patient states she has had a painful ingrown toenail for at least 3 weeks that was severely painful is now still very painful not to the same degree but she is going on a trip in a few weeks and she is concerned that this could flareup to that same level.  Patient does not smoke likes to be active   Review of Systems  All other systems reviewed and are negative.       Objective:  Physical Exam Vitals and nursing note reviewed.  Constitutional:      Appearance: She is well-developed.  Pulmonary:     Effort: Pulmonary effort is normal.  Musculoskeletal:        General: Normal range of motion.  Skin:    General: Skin is warm.  Neurological:     Mental Status: She is alert.     Neurovascular status intact muscle strength found to be adequate range of motion adequate with incurvated lateral border of the left hallux that is painful when pressed and making shoe gear difficult.  Patient has good digital perfusion well oriented x 3 slight redness no active drainage noted     Assessment:  Ingrowing toenail deformity left hallux lateral border with pain     Plan:  H&P reviewed conditions and options and I recommended correction.  Patient would like to get this corrected and I explained procedure risk and she is willing to do this and signed consent form after review.  Today I infiltrated the left big toe 60 mg Xylocaine Marcaine mixture sterile prep done and using sterile instrumentation remove the lateral border exposed matrix applied phenol 3 applications 30 seconds followed by alcohol lavage sterile dressing gave instructions on soaks leave dressing on 24 hours take it off earlier if any throbbing were to occur

## 2022-08-15 NOTE — Patient Instructions (Signed)

## 2022-08-31 ENCOUNTER — Other Ambulatory Visit (HOSPITAL_COMMUNITY): Payer: Self-pay

## 2022-08-31 ENCOUNTER — Other Ambulatory Visit: Payer: Self-pay

## 2022-10-20 ENCOUNTER — Other Ambulatory Visit: Payer: Self-pay

## 2022-10-20 ENCOUNTER — Other Ambulatory Visit (HOSPITAL_COMMUNITY): Payer: Self-pay

## 2022-10-21 ENCOUNTER — Other Ambulatory Visit (HOSPITAL_COMMUNITY): Payer: Self-pay

## 2022-10-21 ENCOUNTER — Other Ambulatory Visit: Payer: Self-pay

## 2022-12-01 ENCOUNTER — Other Ambulatory Visit (HOSPITAL_COMMUNITY): Payer: Self-pay

## 2022-12-01 ENCOUNTER — Other Ambulatory Visit: Payer: Self-pay

## 2022-12-19 DIAGNOSIS — D2272 Melanocytic nevi of left lower limb, including hip: Secondary | ICD-10-CM | POA: Diagnosis not present

## 2022-12-19 DIAGNOSIS — L820 Inflamed seborrheic keratosis: Secondary | ICD-10-CM | POA: Diagnosis not present

## 2022-12-19 DIAGNOSIS — L814 Other melanin hyperpigmentation: Secondary | ICD-10-CM | POA: Diagnosis not present

## 2022-12-19 DIAGNOSIS — D2361 Other benign neoplasm of skin of right upper limb, including shoulder: Secondary | ICD-10-CM | POA: Diagnosis not present

## 2022-12-19 DIAGNOSIS — L821 Other seborrheic keratosis: Secondary | ICD-10-CM | POA: Diagnosis not present

## 2022-12-19 DIAGNOSIS — D485 Neoplasm of uncertain behavior of skin: Secondary | ICD-10-CM | POA: Diagnosis not present

## 2023-01-03 ENCOUNTER — Ambulatory Visit: Payer: Commercial Managed Care - PPO | Admitting: Family Medicine

## 2023-01-03 ENCOUNTER — Other Ambulatory Visit (HOSPITAL_COMMUNITY): Payer: Self-pay

## 2023-01-03 ENCOUNTER — Encounter: Payer: Self-pay | Admitting: Family Medicine

## 2023-01-03 VITALS — BP 108/82 | HR 73 | Ht 67.0 in | Wt 183.0 lb

## 2023-01-03 DIAGNOSIS — M940 Chondrocostal junction syndrome [Tietze]: Secondary | ICD-10-CM | POA: Diagnosis not present

## 2023-01-03 DIAGNOSIS — M9908 Segmental and somatic dysfunction of rib cage: Secondary | ICD-10-CM | POA: Diagnosis not present

## 2023-01-03 DIAGNOSIS — M9902 Segmental and somatic dysfunction of thoracic region: Secondary | ICD-10-CM

## 2023-01-03 DIAGNOSIS — M9901 Segmental and somatic dysfunction of cervical region: Secondary | ICD-10-CM | POA: Diagnosis not present

## 2023-01-03 DIAGNOSIS — M9903 Segmental and somatic dysfunction of lumbar region: Secondary | ICD-10-CM

## 2023-01-03 DIAGNOSIS — M9904 Segmental and somatic dysfunction of sacral region: Secondary | ICD-10-CM

## 2023-01-03 MED ORDER — TIZANIDINE HCL 2 MG PO TABS
2.0000 mg | ORAL_TABLET | Freq: Every day | ORAL | 0 refills | Status: AC
  Filled 2023-01-03: qty 30, 30d supply, fill #0

## 2023-01-03 NOTE — Progress Notes (Signed)
[facility] [address] [place] [address] Phone: [phone] Subjective:    I'm seeing this patient by the request  of:  Jomarie Longs, PA-C  CC: Low back pain follow-up  [id number]  [name] is a 47 y.o. female coming in with complaint of back and neck pain.  Patient has worked hard on weight loss over the years.  Patient states tweaked her back 2 weeks ago, she stepped wrong. has been heating and stretching that has helped , she is going to [place] to hike in a couple of weeks        Reviewing patient's back x-rays in January 2022 showed the patient did have moderate spondylosis at L5-S1 and facet arthropathy.   Reviewed prior external information including notes and imaging from previsou exam, outside providers and external EMR if available.   As well as notes that were available from care everywhere and other healthcare systems.  Since we have seen patient patient has seen podiatry in January for ingrown toenail.  Past medical history, social, surgical and family history all reviewed in electronic medical record.  No pertanent information unless stated regarding to the chief complaint.   Past Medical History:  Diagnosis Date   Gestational diabetes    Heart murmur    during pregnancy   Preeclampsia     Allergies  Allergen Reactions   Belviq [Lorcaserin Hcl]     Crazy dreams     Review of Systems:  No headache, visual changes, nausea, vomiting, diarrhea, constipation, dizziness, abdominal pain, skin rash, fevers, chills, night sweats, weight loss, swollen lymph nodes, body aches, joint swelling, chest pain, shortness of breath, mood changes. POSITIVE muscle aches  Objective  Blood pressure 108/82, pulse 73, height 5\' 7"  (1.702 m), weight 183 lb (83 kg), SpO2 97 %.   General: No apparent distress alert and oriented x3 mood and affect normal, dressed appropriately.  HEENT: Pupils equal, extraocular movements intact   Respiratory: Patient's speak in full sentences and does not appear short of breath  Cardiovascular: No lower extremity edema, non tender, no erythema  Low back exam does have some loss lordosis noted.  Some tenderness to palpation in the paraspinal musculature of the thoracic spine but also on the right sacroiliac joint.  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  Likely no significant radicular symptoms.  Discussed icing regimen and home exercises, discussed which activities to do and which ones to avoid.  Increase activity slowly over the course of next several weeks.  Muscle relaxer prescribed today as well    Nonallopathic problems  Decision today to treat with OMT was based on Physical Exam  After verbal consent patient was treated with HVLA, ME, FPR techniques in cervical, rib, thoracic, lumbar, and sacral  areas  Patient tolerated the procedure well with improvement in symptoms  Patient given exercises, stretches and lifestyle modifications  See medications in patient instructions if given  Patient will follow up in 4-8 weeks     The above documentation has been reviewed and is accurate and complete Judi Saa, DO         Note: This dictation was prepared with Dragon dictation  along with smaller phrase technology. Any transcriptional errors that result from this process are unintentional.

## 2023-01-03 NOTE — Assessment & Plan Note (Signed)
Some signs of suppurative syndrome noted again today.  Likely no significant radicular symptoms.  Discussed icing regimen and home exercises, discussed which activities to do and which ones to avoid.  Increase activity slowly over the course of next several weeks.

## 2023-01-03 NOTE — Patient Instructions (Addendum)
Zanaflex 2mg   [place]  6-8 week follow up

## 2023-01-10 ENCOUNTER — Other Ambulatory Visit: Payer: Self-pay

## 2023-01-27 ENCOUNTER — Ambulatory Visit (INDEPENDENT_AMBULATORY_CARE_PROVIDER_SITE_OTHER): Payer: Commercial Managed Care - PPO | Admitting: Physician Assistant

## 2023-01-27 ENCOUNTER — Encounter: Payer: Self-pay | Admitting: Physician Assistant

## 2023-01-27 ENCOUNTER — Other Ambulatory Visit (HOSPITAL_BASED_OUTPATIENT_CLINIC_OR_DEPARTMENT_OTHER): Payer: Self-pay

## 2023-01-27 VITALS — BP 114/72 | HR 64 | Ht 67.0 in | Wt 180.0 lb

## 2023-01-27 DIAGNOSIS — E663 Overweight: Secondary | ICD-10-CM | POA: Diagnosis not present

## 2023-01-27 DIAGNOSIS — Z23 Encounter for immunization: Secondary | ICD-10-CM | POA: Diagnosis not present

## 2023-01-27 DIAGNOSIS — Z1329 Encounter for screening for other suspected endocrine disorder: Secondary | ICD-10-CM

## 2023-01-27 DIAGNOSIS — Z131 Encounter for screening for diabetes mellitus: Secondary | ICD-10-CM | POA: Diagnosis not present

## 2023-01-27 DIAGNOSIS — Z13220 Encounter for screening for lipoid disorders: Secondary | ICD-10-CM

## 2023-01-27 DIAGNOSIS — Z0000 Encounter for general adult medical examination without abnormal findings: Secondary | ICD-10-CM

## 2023-01-27 DIAGNOSIS — N951 Menopausal and female climacteric states: Secondary | ICD-10-CM | POA: Diagnosis not present

## 2023-01-27 MED ORDER — NALTREXONE-BUPROPION HCL ER 8-90 MG PO TB12
ORAL_TABLET | ORAL | 0 refills | Status: DC
Start: 2023-01-27 — End: 2024-04-02
  Filled 2023-01-27: qty 80, 28d supply, fill #0
  Filled 2023-03-06: qty 80, 30d supply, fill #0

## 2023-01-27 NOTE — Progress Notes (Signed)
Complete physical exam  Patient: [name]   DOB: [birth year]   47 y.o. Female  MRN: [id number]  Subjective:    Chief Complaint  Patient presents with   Annual Exam    [name] is a 47 y.o. female who presents today for a complete physical exam. She reports consuming a general diet.  Runs 3-4 times a week 3 miles.  She generally feels well. She reports sleeping well. She does have additional problems to discuss today. She would like help with weight loss.    Most recent fall risk assessment:    01/27/2023   10:27 AM  Fall Risk   Falls in the past year? 0  Number falls in past yr: 0  Injury with Fall? 0  Risk for fall due to : No Fall Risks  Follow up Falls evaluation completed     Most recent depression screenings:    01/27/2023   10:27 AM 01/26/2022    9:07 AM  PHQ 2/9 Scores  PHQ - 2 Score 0 0    Vision:Within last year and Dental: No current dental problems and Receives regular dental care  Patient Active Problem List   Diagnosis Date Noted   Overweight (BMI 25.0-29.9) 01/05/2022   Skin lesion of back 01/05/2022   Anterior cervical lymphadenopathy 01/26/2021   Perimenopausal 01/26/2021   Sore throat 01/07/2020   Partial tear of left hamstring 08/20/2019   Hamstring tendinitis 06/03/2019   Urinary frequency 01/04/2019   Low energy 01/04/2019   Gluteal tendonitis of left buttock 03/09/2018   Hair loss 12/05/2017   BMI 29.0-29.9,adult 11/25/2016   Slipped rib syndrome 10/14/2016   Nonallopathic lesion of rib cage 10/14/2016   Nonallopathic lesion of cervical region 10/14/2016   Nonallopathic lesion of thoracic region 10/14/2016   Hip flexor tendinitis, right 06/17/2016   Abnormal weight gain 09/18/2015   GDM (gestational diabetes mellitus) 09/18/2015   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 Fogleman;  Location: [facility];  Service: Gynecology;  Laterality: N/A;  Ultrasound Guidance   laser iridotomy Bilateral May 2015   Allergies  Allergen Reactions   Belviq [Lorcaserin Hcl]     Crazy dreams      Patient Care Team: Nolene Ebbs as PCP - General (Family Medicine)   Outpatient Medications Prior to Visit  Medication Sig   Alum & Mag Hydroxide-Simeth (MAG-AL PLUS PO) Take by mouth.   Biotin 1 MG CAPS Take 1 capsule by mouth daily.   cholecalciferol (VITAMIN D3) 25 MCG (1000 UNIT) tablet Take 1 tablet (1,000 Units total) by mouth daily.   estradiol (MINIVELLE) 0.1 MG/24HR patch Place 1 patch (0.1 mg total) onto the skin 2 (two) times a week.   fluorouracil (EFUDEX) 5 % cream Apply 1 application liberally to affected area twice a day ; apply to lower legs in the fall or winter   folic acid (FOLVITE) 1 MG tablet Take 1 tablet (1 mg total) by mouth daily.   meloxicam (MOBIC) 7.5 MG tablet Take 1 tablet (7.5 mg total) by mouth daily.   progesterone (PROMETRIUM)  100 MG capsule Take 1 capsule (100 mg total) by mouth at bedtime.   tiZANidine (ZANAFLEX) 2 MG tablet Take 1 tablet (2 mg total) by mouth at bedtime.   [DISCONTINUED] estradiol (MINIVELLE) 0.1 MG/24HR patch Place 1 patch (0.1 mg total) onto the skin 2 (two) times a week.   [DISCONTINUED] estradiol (MINIVELLE) 0.1 MG/24HR patch Place 1 patch (0.1 mg total) onto the skin 2 (two) times a week.   [DISCONTINUED] estradiol (VIVELLE-DOT) 0.1 MG/24HR patch Place 1 patch (0.1 mg total) onto the skin 2 (two) times a week.   [DISCONTINUED] ibuprofen (ADVIL) 200 MG tablet Take 200 mg by mouth every 6 (six) hours as needed.   [DISCONTINUED] progesterone (PROMETRIUM) 100 MG capsule Take 1 capsule (100 mg total) by mouth at  bedtime.   [DISCONTINUED] tiZANidine (ZANAFLEX) 4 MG tablet Take 1 tablet (4 mg total) by mouth at bedtime as needed for muscle spasms.   [DISCONTINUED] folic acid (FOLVITE) 1 MG tablet Take 1 tablet (1 mg total) by mouth daily.   [DISCONTINUED] WEGOVY 0.25 MG/0.5ML SOAJ Inject 0.25 mg into the skin once a week. Use this dose for 1 month (4 shots) and then increase to next higher dose.   [DISCONTINUED] WEGOVY 0.5 MG/0.5ML SOAJ Inject 0.5 mg into the skin once a week. Use this dose for 1 month (4 shots) and then increase to next higher dose.   [DISCONTINUED] WEGOVY 1 MG/0.5ML SOAJ Inject 1 mg into the skin once a week. Use this dose for 1 month (4 shots) and then increase to next higher dose.   No facility-administered medications prior to visit.    Review of Systems  All other systems reviewed and are negative.         Objective:     BP 114/72 (BP Location: Right Arm, Patient Position: Sitting, Cuff Size: Normal)   Pulse 64   Ht 5\' 7"  (1.702 m)   Wt 180 lb (81.6 kg)   SpO2 96%   BMI 28.19 kg/m  BP Readings from Last 3 Encounters:  01/27/23 114/72  01/03/23 108/82  03/07/22 112/76   Wt Readings from Last 3 Encounters:  01/27/23 180 lb (81.6 kg)  01/03/23 183 lb (83 kg)  03/07/22 177 lb (80.3 kg)      Physical Exam   BP 114/72 (BP Location: Right Arm, Patient Position: Sitting, Cuff Size: Normal)   Pulse 64   Ht 5\' 7"  (1.702 m)   Wt 180 lb (81.6 kg)   SpO2 96%   BMI 28.19 kg/m   General Appearance:    Alert, cooperative, no distress, appears stated age  Head:    Normocephalic, without obvious abnormality, atraumatic  Eyes:    PERRL, conjunctiva/corneas clear, EOM's intact, fundi    benign, both eyes  Ears:    Normal TM's and external ear canals, both ears  Nose:   Nares normal, septum midline, mucosa normal, no drainage    or sinus tenderness  Throat:   Lips, mucosa, and tongue normal; teeth and gums normal  Neck:   Supple, symmetrical, trachea midline, no  adenopathy;    thyroid:  no enlargement/tenderness/nodules; no carotid   bruit or JVD  Back:     Symmetric, no curvature, ROM normal, no CVA tenderness  Lungs:     Clear to auscultation bilaterally, respirations unlabored  Chest Wall:    No tenderness or deformity   Heart:    Regular rate and rhythm, S1 and S2 normal, no murmur, rub   or gallop  Abdomen:     Soft, non-tender, bowel sounds active all four quadrants,    no masses, no organomegaly        Extremities:   Extremities normal, atraumatic, no cyanosis or edema  Pulses:   2+ and symmetric all extremities  Skin:   Skin color, texture, turgor normal, no rashes or lesions  Lymph nodes:   Cervical, supraclavicular, and axillary nodes normal  Neurologic:   CNII-XII intact, normal strength, sensation and reflexes    throughout      Assessment & Plan:    Routine Health Maintenance and Physical Exam  Immunization History  Administered Date(s) Administered   Influenza,inj,Quad PF,6+ Mos 05/22/2020   Influenza-Unspecified 05/17/2014, 05/15/2015, 05/22/2018, 05/17/2021   PFIZER(Purple Top)SARS-COV-2 Vaccination 08/27/2019, 09/17/2019, 05/08/2020   Tdap 01/27/2023    Health Maintenance  Topic Date Due   COVID-19 Vaccine (4 - 2023-24 season) 02/12/2023 (Originally 04/08/2022)   INFLUENZA VACCINE  03/09/2023   MAMMOGRAM  08/13/2023   Fecal DNA (Cologuard)  02/05/2024   PAP SMEAR-Modifier  01/26/2025   DTaP/Tdap/Td (2 - Td or Tdap) 01/26/2033   Hepatitis C Screening  Completed   HIV Screening  Completed   HPV VACCINES  Aged Out    Discussed health benefits of physical activity, and encouraged her to engage in regular exercise appropriate for her age and condition. Marland KitchenBard Herbert was seen today for annual exam.  Diagnoses and all orders for this visit:  Routine physical examination -     Cancel: TSH -     Lipid Panel w/reflex Direct LDL -     COMPLETE METABOLIC PANEL WITH GFR -     Cancel: CBC with Differential/Platelet -      Lipoprotein A (LPA) -     B12 and Folate Panel -     VITAMIN D 25 Hydroxy (Vit-D Deficiency, Fractures) -     Lipid panel -     TSH -     CBC w/Diff/Platelet -     CMP and Liver  Perimenopausal -     Lipid panel -     TSH -     CBC w/Diff/Platelet -     CMP and Liver  Screening for diabetes mellitus -     COMPLETE METABOLIC PANEL WITH GFR -     CMP and Liver  Screening for lipid disorders -     Lipid Panel w/reflex Direct LDL -     Lipoprotein A (LPA) -     Lipid panel -     CMP and Liver  Screening for thyroid disorder -     TSH -     CMP and Liver  Overweight (BMI 25.0-29.9) -     Naltrexone-buPROPion HCl ER 8-90 MG TB12; Take 1 tab daily for week 1, then 1 tab twice a day for week 2, then 2 tab in morning and 1 tablet every evening for week 3, then 2 tabs twice daily -     CMP and Liver  Need for Tdap vaccination -     Tdap vaccine greater than or equal to 7yo IM   . Discussed 150 minutes of exercise a week.  Encouraged vitamin D 1000 units and Calcium 1300mg  or 4 servings of dairy a day.  PHQ no concerns Fasting labs ordered Mammogram/pap UTD .Marland KitchenDiscussed low carb diet with 1500 calories and 80g of protein.  Exercising at least 150 minutes a week.  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  Start contrave Discussed SE Follow up in  1 month on weight   Return in about 1 year (around 01/27/2024).     Tandy Gaw, PA-C

## 2023-01-27 NOTE — Patient Instructions (Signed)

## 2023-01-28 LAB — LIPID PANEL
Chol/HDL Ratio: 2.6 ratio (ref 0.0–4.4)
Cholesterol, Total: 182 mg/dL (ref 100–199)
HDL: 71 mg/dL (ref >39)
LDL Chol Calc (NIH): 99 mg/dL (ref 0–99)
Triglycerides: 62 mg/dL (ref 0–149)
VLDL Cholesterol Cal: 12 mg/dL (ref 5–40)

## 2023-01-28 LAB — CMP AND LIVER
ALT: 11 IU/L (ref 0–32)
AST: 14 IU/L (ref 0–40)
Albumin: 4.4 g/dL (ref 3.9–4.9)
Alkaline Phosphatase: 53 IU/L (ref 44–121)
BUN: 18 mg/dL (ref 6–24)
Bilirubin Total: 0.9 mg/dL (ref 0.0–1.2)
Bilirubin, Direct: 0.19 mg/dL (ref 0.00–0.40)
CO2: 25 mmol/L (ref 20–29)
Calcium: 9.2 mg/dL (ref 8.7–10.2)
Chloride: 105 mmol/L (ref 96–106)
Creatinine, Ser: 0.78 mg/dL (ref 0.57–1.00)
Glucose: 91 mg/dL (ref 70–99)
Potassium: 4.4 mmol/L (ref 3.5–5.2)
Sodium: 142 mmol/L (ref 134–144)
Total Protein: 6.5 g/dL (ref 6.0–8.5)
eGFR: 93 mL/min/{1.73_m2} (ref >59)

## 2023-01-28 LAB — CBC WITH DIFFERENTIAL/PLATELET
Basophils Absolute: 0.1 10*3/uL (ref 0.0–0.2)
Basos: 1 %
EOS (ABSOLUTE): 0.1 10*3/uL (ref 0.0–0.4)
Eos: 3 %
Hematocrit: 42 % (ref 34.0–46.6)
Hemoglobin: 14.9 g/dL (ref 11.1–15.9)
Immature Grans (Abs): 0 10*3/uL (ref 0.0–0.1)
Immature Granulocytes: 0 %
Lymphocytes Absolute: 1.6 10*3/uL (ref 0.7–3.1)
Lymphs: 31 %
MCH: 31 pg (ref 26.6–33.0)
MCHC: 35.5 g/dL (ref 31.5–35.7)
MCV: 87 fL (ref 79–97)
Monocytes Absolute: 0.5 10*3/uL (ref 0.1–0.9)
Monocytes: 9 %
Neutrophils Absolute: 2.9 10*3/uL (ref 1.4–7.0)
Neutrophils: 56 %
Platelets: 197 10*3/uL (ref 150–450)
RBC: 4.81 x10E6/uL (ref 3.77–5.28)
RDW: 12.2 % (ref 11.7–15.4)
WBC: 5.1 10*3/uL (ref 3.4–10.8)

## 2023-01-28 LAB — B12 AND FOLATE PANEL: Folate: 9.7 ng/mL (ref >3.0)

## 2023-01-28 LAB — LIPOPROTEIN A (LPA)

## 2023-01-28 LAB — TSH: TSH: 1.65 u[IU]/mL (ref 0.450–4.500)

## 2023-01-30 ENCOUNTER — Telehealth: Payer: Self-pay

## 2023-01-30 LAB — B12 AND FOLATE PANEL: Vitamin B-12: 487 pg/mL (ref 232–1245)

## 2023-01-30 LAB — VITAMIN D 25 HYDROXY (VIT D DEFICIENCY, FRACTURES): Vit D, 25-Hydroxy: 42.1 ng/mL (ref 30.0–100.0)

## 2023-01-30 NOTE — Progress Notes (Signed)
[place],   Kidney, liver, glucose looks good.  Vitamin D looks good.  B12 looks good.  Thyroid looks good.  Cholesterol looks good.   [name].The 10-year ASCVD risk score (Arnett DK, et al., 2019) is: 0.6%   Values used to calculate the score:     Age: 47 years     Sex: Female     Is Non-Hispanic African American: No     Diabetic: No     Tobacco smoker: No     Systolic Blood Pressure: 114 mmHg     Is BP treated: No     HDL Cholesterol: 71 mg/dL     Total Cholesterol: 182 mg/dL

## 2023-01-30 NOTE — Telephone Encounter (Signed)
PA for Contrave

## 2023-01-31 ENCOUNTER — Encounter: Payer: Self-pay | Admitting: Physician Assistant

## 2023-01-31 ENCOUNTER — Other Ambulatory Visit (HOSPITAL_BASED_OUTPATIENT_CLINIC_OR_DEPARTMENT_OTHER): Payer: Self-pay

## 2023-01-31 DIAGNOSIS — E7841 Elevated Lipoprotein(a): Secondary | ICD-10-CM | POA: Insufficient documentation

## 2023-01-31 MED ORDER — ROSUVASTATIN CALCIUM 5 MG PO TABS
5.0000 mg | ORAL_TABLET | Freq: Every day | ORAL | 3 refills | Status: DC
  Filled 2023-01-31: qty 90, 90d supply, fill #0
  Filled 2023-04-27: qty 90, 90d supply, fill #1
  Filled 2023-08-22: qty 90, 90d supply, fill #2
  Filled 2023-08-28: qty 90, 90d supply, fill #0
  Filled 2023-12-05: qty 90, 90d supply, fill #1

## 2023-01-31 NOTE — Progress Notes (Signed)
Lipoprotein A is pretty elevated. Although your 10 year risk is .06 percent and HDL is good. Would you like to start a low dose statin daily for cardiovascular prevention? I would also start ASA 81mg  daily. Certainly need to get cholesterol labs yearly. I could also send to cardiology to discuss with them CV risk prevention. Thoughts?

## 2023-01-31 NOTE — Addendum Note (Signed)
Addended byJomarie Longs on: 01/31/2023 11:22 AM   Modules accepted: Orders

## 2023-02-01 ENCOUNTER — Other Ambulatory Visit (HOSPITAL_COMMUNITY): Payer: Self-pay

## 2023-02-02 ENCOUNTER — Other Ambulatory Visit (HOSPITAL_BASED_OUTPATIENT_CLINIC_OR_DEPARTMENT_OTHER): Payer: Self-pay

## 2023-02-06 ENCOUNTER — Other Ambulatory Visit (HOSPITAL_BASED_OUTPATIENT_CLINIC_OR_DEPARTMENT_OTHER): Payer: Self-pay

## 2023-02-06 ENCOUNTER — Telehealth: Payer: Self-pay

## 2023-02-06 NOTE — Telephone Encounter (Addendum)
Initiated Prior authorization [id number] 8-90MG  er tablets Via: Covermymeds Case/Key:[id number]  Status: approved as of 02/06/23 Reason:Authorization Expiration Date: 06/02/2023  Notified Pt via: Mychart

## 2023-02-06 NOTE — Telephone Encounter (Signed)
Message sent to patient via Mychart that PA has been initiated.

## 2023-02-07 ENCOUNTER — Other Ambulatory Visit: Payer: Self-pay

## 2023-02-07 ENCOUNTER — Other Ambulatory Visit (HOSPITAL_BASED_OUTPATIENT_CLINIC_OR_DEPARTMENT_OTHER): Payer: Self-pay

## 2023-02-13 ENCOUNTER — Other Ambulatory Visit (HOSPITAL_BASED_OUTPATIENT_CLINIC_OR_DEPARTMENT_OTHER): Payer: Self-pay

## 2023-02-16 ENCOUNTER — Other Ambulatory Visit (HOSPITAL_BASED_OUTPATIENT_CLINIC_OR_DEPARTMENT_OTHER): Payer: Self-pay

## 2023-02-20 ENCOUNTER — Other Ambulatory Visit (HOSPITAL_BASED_OUTPATIENT_CLINIC_OR_DEPARTMENT_OTHER): Payer: Self-pay

## 2023-02-21 ENCOUNTER — Other Ambulatory Visit (HOSPITAL_BASED_OUTPATIENT_CLINIC_OR_DEPARTMENT_OTHER): Payer: Self-pay

## 2023-02-23 ENCOUNTER — Other Ambulatory Visit (HOSPITAL_BASED_OUTPATIENT_CLINIC_OR_DEPARTMENT_OTHER): Payer: Self-pay

## 2023-02-27 ENCOUNTER — Other Ambulatory Visit (HOSPITAL_COMMUNITY): Payer: Self-pay

## 2023-02-27 ENCOUNTER — Other Ambulatory Visit (HOSPITAL_BASED_OUTPATIENT_CLINIC_OR_DEPARTMENT_OTHER): Payer: Self-pay

## 2023-02-28 ENCOUNTER — Other Ambulatory Visit (HOSPITAL_BASED_OUTPATIENT_CLINIC_OR_DEPARTMENT_OTHER): Payer: Self-pay

## 2023-02-28 ENCOUNTER — Other Ambulatory Visit (HOSPITAL_COMMUNITY): Payer: Self-pay

## 2023-02-28 ENCOUNTER — Encounter (HOSPITAL_COMMUNITY): Payer: Self-pay | Admitting: Pharmacist

## 2023-03-03 ENCOUNTER — Other Ambulatory Visit (HOSPITAL_COMMUNITY): Payer: Self-pay

## 2023-03-03 ENCOUNTER — Other Ambulatory Visit (HOSPITAL_BASED_OUTPATIENT_CLINIC_OR_DEPARTMENT_OTHER): Payer: Self-pay

## 2023-03-04 ENCOUNTER — Other Ambulatory Visit (HOSPITAL_COMMUNITY): Payer: Self-pay

## 2023-03-06 ENCOUNTER — Other Ambulatory Visit (HOSPITAL_COMMUNITY): Payer: Self-pay

## 2023-03-06 ENCOUNTER — Other Ambulatory Visit (HOSPITAL_BASED_OUTPATIENT_CLINIC_OR_DEPARTMENT_OTHER): Payer: Self-pay

## 2023-03-06 MED ORDER — PROGESTERONE MICRONIZED 100 MG PO CAPS
100.0000 mg | ORAL_CAPSULE | Freq: Every day | ORAL | 0 refills | Status: DC
Start: 2023-03-06 — End: 2023-05-23
  Filled 2023-03-06: qty 80, 80d supply, fill #0
  Filled 2023-03-06: qty 10, 10d supply, fill #0

## 2023-03-06 MED ORDER — ESTRADIOL 0.1 MG/24HR TD PTTW
1.0000 | MEDICATED_PATCH | TRANSDERMAL | 0 refills | Status: DC
  Filled 2023-03-06 – 2023-04-05 (×3): qty 24, 84d supply, fill #0

## 2023-03-10 ENCOUNTER — Other Ambulatory Visit (HOSPITAL_COMMUNITY): Payer: Self-pay

## 2023-03-17 ENCOUNTER — Other Ambulatory Visit (HOSPITAL_COMMUNITY): Payer: Self-pay

## 2023-03-17 DIAGNOSIS — N952 Postmenopausal atrophic vaginitis: Secondary | ICD-10-CM | POA: Diagnosis not present

## 2023-03-17 DIAGNOSIS — L293 Anogenital pruritus, unspecified: Secondary | ICD-10-CM | POA: Diagnosis not present

## 2023-03-17 DIAGNOSIS — R300 Dysuria: Secondary | ICD-10-CM | POA: Diagnosis not present

## 2023-03-17 DIAGNOSIS — Z118 Encounter for screening for other infectious and parasitic diseases: Secondary | ICD-10-CM | POA: Diagnosis not present

## 2023-03-17 MED ORDER — ESTRADIOL 0.1 MG/GM VA CREA
TOPICAL_CREAM | VAGINAL | 1 refills | Status: DC
  Filled 2023-03-17: qty 42.5, 90d supply, fill #0

## 2023-03-17 MED ORDER — CLOBETASOL PROPIONATE 0.05 % EX OINT
TOPICAL_OINTMENT | Freq: Two times a day (BID) | CUTANEOUS | 0 refills | Status: DC
  Filled 2023-03-17: qty 15, 15d supply, fill #0

## 2023-04-05 ENCOUNTER — Other Ambulatory Visit (HOSPITAL_COMMUNITY): Payer: Self-pay

## 2023-04-21 ENCOUNTER — Ambulatory Visit
Admission: RE | Admit: 2023-04-21 | Discharge: 2023-04-21 | Disposition: A | Discharge status: Home / Self Care | Payer: Commercial Managed Care - PPO | Source: Ambulatory Visit | Attending: Physician Assistant | Admitting: Physician Assistant

## 2023-04-21 ENCOUNTER — Other Ambulatory Visit: Payer: Self-pay

## 2023-04-21 VITALS — BP 106/66 | HR 60 | Temp 97.9°F | Resp 16

## 2023-04-21 DIAGNOSIS — J069 Acute upper respiratory infection, unspecified: Secondary | ICD-10-CM | POA: Diagnosis not present

## 2023-04-21 NOTE — ED Provider Notes (Signed)
[facility]    CSN: [id number] Arrival date & time: 04/21/23  1603      History   Chief Complaint Chief Complaint  Patient presents with   Ear Fullness    HPI [name] is a 47 y.o. female.   Patient complains of left ear pain.  Patient reports she has had a viral-like illness for the past week.  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  Patient denies having any fever or chills she has had some cough and congestion.  Patient reports that she has a crackling sensation in her left ear.  Patient reports pain in left ear.  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   Overweight (BMI 25.0-29.9) 01/05/2022   Skin lesion of back 01/05/2022   Anterior cervical lymphadenopathy 01/26/2021   Perimenopausal 01/26/2021   Sore throat 01/07/2020   Partial tear of left hamstring 08/20/2019   Hamstring tendinitis 06/03/2019   Urinary frequency 01/04/2019   Low energy 01/04/2019   Gluteal tendonitis of left buttock 03/09/2018   Hair loss 12/05/2017   BMI 29.0-29.9,adult 11/25/2016   Slipped rib syndrome 10/14/2016   Nonallopathic lesion of rib cage 10/14/2016   Nonallopathic lesion of cervical region 10/14/2016   Nonallopathic lesion of thoracic region 10/14/2016   Hip flexor tendinitis, right 06/17/2016   Abnormal weight gain 09/18/2015   GDM (gestational diabetes mellitus) 09/18/2015   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  Fogleman;  Location: [facility];  Service: Gynecology;  Laterality: N/A;  Ultrasound Guidance   laser iridotomy Bilateral May 2015    OB History   No obstetric history on file.      Home Medications    Prior to Admission medications   Medication Sig Start Date End Date Taking? Authorizing Provider  Alum & Mag Hydroxide-Simeth (MAG-AL PLUS PO) Take by mouth.    [provider]  Biotin 1 MG CAPS Take 1 capsule by mouth daily. 04/29/22   Breeback, Lonna Cobb, PA-C  cholecalciferol (VITAMIN D3) 25 MCG (1000 UNIT) tablet Take 1 tablet (1,000 Units total) by mouth daily. 06/11/21   Breeback, Lonna Cobb, PA-C  clobetasol ointment (TEMOVATE) 0.05 % Apply a thin layer topically to affected area(s) 2 (two) times daily. 03/17/23     estradiol (ESTRACE) 0.1 MG/GM vaginal cream place a blueberry sized drop of cream into vagina nighlty for 2 weeks then twice weekly. 03/17/23     estradiol (MINIVELLE) 0.1 MG/24HR patch Place 1 patch (0.1 mg total) onto the skin 2 (two) times a week. 02/24/22     estradiol (VIVELLE-DOT) 0.1 MG/24HR patch Place 1 patch (0.1 mg total) onto the skin 2 (two) times a week. 03/06/23     fluorouracil (EFUDEX) 5 % cream Apply 1 application liberally to affected area twice a day ; apply to lower legs in the fall or winter 02/22/22     folic acid (  FOLVITE) 1 MG tablet Take 1 tablet (1 mg total) by mouth daily. 01/26/22     meloxicam (MOBIC) 7.5 MG tablet Take 1 tablet (7.5 mg total) by mouth daily. 03/07/22   Judi Saa, DO  Naltrexone-buPROPion HCl ER 8-90 MG TB12 Take 1 tablet by mouth daily for 1 week, then 1 tablet twice a day for 1 week, then 2 tablets in morning and 1 tablet every evening for 1 week, and then 2 tablets twice daily. 01/27/23   Breeback, Lonna Cobb, PA-C  progesterone (PROMETRIUM) 100 MG capsule Take 1 capsule (100 mg total) by mouth at bedtime. 01/26/22     progesterone (PROMETRIUM) 100 MG capsule Take 1 capsule (100 mg total) by mouth at bedtime. 03/06/23     rosuvastatin  (CRESTOR) 5 MG tablet Take 1 tablet (5 mg total) by mouth daily. 01/31/23   Breeback, Jade L, PA-C  tiZANidine (ZANAFLEX) 2 MG tablet Take 1 tablet (2 mg total) by mouth at bedtime. 01/03/23   Judi Saa, DO    Family History Family History  Problem Relation Age of Onset   Sudden death Mother        Age 52; ? MI   Coronary artery disease Father        Age 42   Diabetes Father    Hyperlipidemia Maternal Aunt    Pulmonary fibrosis Maternal Aunt    Breast cancer Neg Hx     Social History Social History   Tobacco Use   Smoking status: Never   Smokeless tobacco: Never  Vaping Use   Vaping status: Never Used  Substance Use Topics   Alcohol use: Yes    Comment: occasionally   Drug use: No     Allergies   Belviq [lorcaserin hcl]   Review of Systems Review of Systems  All other systems reviewed and are negative.    Physical Exam Triage Vital Signs ED Triage Vitals  Encounter Vitals Group     BP 04/21/23 1616 106/66     Systolic BP Percentile --      Diastolic BP Percentile --      Pulse Rate 04/21/23 1616 60     Resp 04/21/23 1616 16     Temp 04/21/23 1616 97.9 F (36.6 C)     Temp Source 04/21/23 1616 Oral     SpO2 04/21/23 1616 99 %     Weight --      Height --      Head Circumference --      Peak Flow --      Pain Score 04/21/23 1619 3     Pain Loc --      Pain Education --      Exclude from Growth Chart --    No data found.  Updated Vital Signs BP 106/66 (BP Location: Left Arm)   Pulse 60   Temp 97.9 F (36.6 C) (Oral)   Resp 16   SpO2 99%   Visual Acuity Right Eye Distance:   Left Eye Distance:   Bilateral Distance:    Right Eye Near:   Left Eye Near:    Bilateral Near:     Physical Exam Vitals and nursing note reviewed.  Constitutional:      Appearance: She is well-developed.  HENT:     Head: Normocephalic.     Right Ear: Tympanic membrane normal.     Left Ear: Tympanic membrane normal.     Nose: Nose normal.      Mouth/Throat:  Mouth: Mucous membranes are moist.  Cardiovascular:     Rate and Rhythm: Normal rate.  Pulmonary:     Effort: Pulmonary effort is normal.  Abdominal:     General: There is no distension.  Musculoskeletal:        General: Normal range of motion.     Cervical back: Normal range of motion.  Skin:    General: Skin is warm.  Neurological:     General: No focal deficit present.     Mental Status: She is alert and oriented to person, place, and time.      UC Treatments / Results  Labs (all labs ordered are listed, but only abnormal results are displayed) Labs Reviewed - No data to display  EKG   Radiology No results found.  Procedures Procedures (including critical care time)  Medications Ordered in UC Medications - No data to display  Initial Impression / Assessment and Plan / UC Course  I have reviewed the triage vital signs and the nursing notes.  Pertinent labs & imaging results that were available during my care of the patient were reviewed by me and considered in my medical decision making (see chart for details).     Patient has normal exam no signs of otitis media.  I counseled patient on symptom management.  I have advised to recheck in 2 days if pain is not improving. Final Clinical Impressions(s) / UC Diagnoses   Final diagnoses:  Viral URI     Discharge Instructions      Tylenol for discomfort.  Try Zyrtec D or Claritin D to help with symptoms.  Continue Flonase   ED Prescriptions   None    PDMP not reviewed this encounter. An After Visit Summary was printed and given to the patient.       Elson Areas, [place] 04/21/23 1720

## 2023-04-21 NOTE — Discharge Instructions (Addendum)
Tylenol for discomfort.  Try Zyrtec D or Claritin D to help with symptoms.  Continue Flonase

## 2023-04-21 NOTE — ED Triage Notes (Signed)
Congestion and lymph nodes swollen since Monday/Tuesday, has some residual ear fullness since then. Left is more full than the right.

## 2023-05-01 ENCOUNTER — Encounter: Payer: Self-pay | Admitting: Physician Assistant

## 2023-05-01 DIAGNOSIS — N6311 Unspecified lump in the right breast, upper outer quadrant: Secondary | ICD-10-CM

## 2023-05-03 ENCOUNTER — Other Ambulatory Visit (HOSPITAL_BASED_OUTPATIENT_CLINIC_OR_DEPARTMENT_OTHER): Payer: Self-pay

## 2023-05-03 NOTE — Addendum Note (Signed)
Addended by: Jomarie Longs on: 05/03/2023 04:04 PM   Modules accepted: Orders

## 2023-05-23 ENCOUNTER — Other Ambulatory Visit (HOSPITAL_COMMUNITY): Payer: Self-pay

## 2023-05-23 ENCOUNTER — Other Ambulatory Visit: Payer: Self-pay

## 2023-05-23 DIAGNOSIS — Z01419 Encounter for gynecological examination (general) (routine) without abnormal findings: Secondary | ICD-10-CM | POA: Diagnosis not present

## 2023-05-23 DIAGNOSIS — Z1331 Encounter for screening for depression: Secondary | ICD-10-CM | POA: Diagnosis not present

## 2023-05-23 MED ORDER — FOLIC ACID 1 MG PO TABS
1.0000 mg | ORAL_TABLET | Freq: Every day | ORAL | 3 refills | Status: AC
  Filled 2023-05-23: qty 90, 90d supply, fill #0
  Filled 2023-06-29 – 2023-08-28 (×3): qty 90, 90d supply, fill #1
  Filled 2023-12-05: qty 90, 90d supply, fill #2
  Filled 2024-03-07: qty 90, 90d supply, fill #3

## 2023-05-23 MED ORDER — ESTRADIOL 0.1 MG/24HR TD PTTW
1.0000 | MEDICATED_PATCH | TRANSDERMAL | 3 refills | Status: DC
Start: 2023-05-25 — End: 2024-05-23
  Filled 2023-05-23 – 2023-06-29 (×3): qty 24, 84d supply, fill #0
  Filled 2023-08-28 – 2023-09-27 (×2): qty 24, 84d supply, fill #1
  Filled 2023-12-05: qty 24, 84d supply, fill #2
  Filled 2024-03-07: qty 24, 84d supply, fill #3

## 2023-05-23 MED ORDER — PROGESTERONE MICRONIZED 100 MG PO CAPS
100.0000 mg | ORAL_CAPSULE | Freq: Every day | ORAL | 3 refills | Status: DC
  Filled 2023-05-23: qty 90, 90d supply, fill #0
  Filled 2023-08-28 (×2): qty 90, 90d supply, fill #1
  Filled 2023-12-05: qty 90, 90d supply, fill #2
  Filled 2024-03-07: qty 90, 90d supply, fill #3

## 2023-05-23 MED ORDER — ESTRADIOL 10 MCG VA TABS
1.0000 | ORAL_TABLET | VAGINAL | 3 refills | Status: DC
  Filled 2023-05-23: qty 16, 21d supply, fill #0

## 2023-05-31 ENCOUNTER — Ambulatory Visit
Admission: RE | Admit: 2023-05-31 | Discharge: 2023-05-31 | Disposition: A | Discharge status: Home / Self Care | Payer: Commercial Managed Care - PPO | Source: Ambulatory Visit | Attending: Physician Assistant | Admitting: Physician Assistant

## 2023-05-31 DIAGNOSIS — N6311 Unspecified lump in the right breast, upper outer quadrant: Secondary | ICD-10-CM

## 2023-05-31 DIAGNOSIS — N644 Mastodynia: Secondary | ICD-10-CM | POA: Diagnosis not present

## 2023-05-31 DIAGNOSIS — N6310 Unspecified lump in the right breast, unspecified quadrant: Secondary | ICD-10-CM | POA: Diagnosis not present

## 2023-05-31 NOTE — Progress Notes (Signed)
GREAT news. Normal mammogram.

## 2023-06-29 ENCOUNTER — Other Ambulatory Visit (HOSPITAL_COMMUNITY): Payer: Self-pay

## 2023-06-29 ENCOUNTER — Other Ambulatory Visit: Payer: Self-pay

## 2023-08-28 ENCOUNTER — Other Ambulatory Visit: Payer: Self-pay

## 2023-08-28 ENCOUNTER — Other Ambulatory Visit (HOSPITAL_COMMUNITY): Payer: Self-pay

## 2023-08-28 ENCOUNTER — Other Ambulatory Visit (HOSPITAL_BASED_OUTPATIENT_CLINIC_OR_DEPARTMENT_OTHER): Payer: Self-pay

## 2023-09-27 ENCOUNTER — Other Ambulatory Visit: Payer: Self-pay | Admitting: Physician Assistant

## 2023-09-27 DIAGNOSIS — Z1231 Encounter for screening mammogram for malignant neoplasm of breast: Secondary | ICD-10-CM

## 2023-10-06 ENCOUNTER — Ambulatory Visit
Admission: RE | Admit: 2023-10-06 | Discharge: 2023-10-06 | Disposition: A | Discharge status: Home / Self Care | Payer: Commercial Managed Care - PPO | Source: Ambulatory Visit | Attending: Physician Assistant | Admitting: Physician Assistant

## 2023-10-06 DIAGNOSIS — Z1231 Encounter for screening mammogram for malignant neoplasm of breast: Secondary | ICD-10-CM

## 2023-10-10 ENCOUNTER — Encounter: Payer: Self-pay | Admitting: Physician Assistant

## 2023-10-10 NOTE — Progress Notes (Signed)
 Normal mammogram. Follow up in 1 year.

## 2023-10-31 DIAGNOSIS — H52223 Regular astigmatism, bilateral: Secondary | ICD-10-CM | POA: Diagnosis not present

## 2023-12-06 ENCOUNTER — Other Ambulatory Visit: Payer: Self-pay

## 2023-12-06 ENCOUNTER — Other Ambulatory Visit (HOSPITAL_COMMUNITY): Payer: Self-pay

## 2023-12-17 ENCOUNTER — Encounter: Payer: Self-pay | Admitting: Family Medicine

## 2023-12-18 ENCOUNTER — Ambulatory Visit (INDEPENDENT_AMBULATORY_CARE_PROVIDER_SITE_OTHER): Admitting: Family Medicine

## 2023-12-18 ENCOUNTER — Encounter: Payer: Self-pay | Admitting: Family Medicine

## 2023-12-18 ENCOUNTER — Ambulatory Visit
Admission: RE | Admit: 2023-12-18 | Discharge: 2023-12-18 | Disposition: A | Discharge status: Home / Self Care | Source: Ambulatory Visit | Attending: Family Medicine | Admitting: Family Medicine

## 2023-12-18 ENCOUNTER — Other Ambulatory Visit (HOSPITAL_COMMUNITY): Payer: Self-pay

## 2023-12-18 VITALS — BP 127/67 | Ht 67.0 in | Wt 160.0 lb

## 2023-12-18 DIAGNOSIS — M545 Low back pain, unspecified: Secondary | ICD-10-CM | POA: Diagnosis not present

## 2023-12-18 DIAGNOSIS — M5416 Radiculopathy, lumbar region: Secondary | ICD-10-CM

## 2023-12-18 MED ORDER — BACLOFEN 10 MG PO TABS
10.0000 mg | ORAL_TABLET | Freq: Three times a day (TID) | ORAL | 1 refills | Status: DC | PRN
  Filled 2023-12-18: qty 60, 20d supply, fill #0

## 2023-12-18 MED ORDER — MELOXICAM 15 MG PO TABS
15.0000 mg | ORAL_TABLET | Freq: Every day | ORAL | 1 refills | Status: AC
Start: 2023-12-18 — End: ?
  Filled 2023-12-18: qty 30, 30d supply, fill #0

## 2023-12-18 NOTE — Progress Notes (Cosign Needed)
 PCP: Araceli Knight, PA-C  Subjective:   HPI: Patient is a 48 y.o. female here for low back pain.  [name] was mulching and rotating a mattress this weekend and when she went to put the sheets on the bed and lifted the mattress, she felt her lower back buckle and immense pain.  She was on the floor and needed her significant other to help her up. Most of her pain is on the right side. Her pain is mainly in her low back with radiation to her right posterior mid-thigh. She does not endorse any numbness. She has had limited mobility and pain with movement. She had a similar injury 20-years ago that improved with rest and PT. She has had several low back strains and this episode feels different. She has been taking 800mg  ibuprofen BID, 4mg  Zanaflex  and applied ice/heat with minimal relief.  She denies any urinary/fecal incontinence, leg weakness or saddle anesthesia.   Past Medical History:  Diagnosis Date   Gestational diabetes    Heart murmur    during pregnancy   Preeclampsia     Current Outpatient Medications on File Prior to Visit  Medication Sig Dispense Refill   Alum & Mag Hydroxide-Simeth (MAG-AL PLUS PO) Take by mouth.     Biotin  1 MG CAPS Take 1 capsule by mouth daily. 30 capsule PRN   cholecalciferol  (VITAMIN D3) 25 MCG (1000 UNIT) tablet Take 1 tablet (1,000 Units total) by mouth daily. 90 tablet 3   clobetasol  ointment (TEMOVATE ) 0.05 % Apply a thin layer topically to affected area(s) 2 (two) times daily. 15 g 0   estradiol  (ESTRACE ) 0.1 MG/GM vaginal cream place a blueberry sized drop of cream into vagina nighlty for 2 weeks then twice weekly. 42.5 g 1   estradiol  (MINIVELLE ) 0.1 MG/24HR patch Place 1 patch (0.1 mg total) onto the skin 2 (two) times a week. 24 patch 3   estradiol  (VIVELLE -DOT) 0.1 MG/24HR patch Place 1 patch (0.1 mg total) onto the skin 2 (two) times a week. 24 patch 0   estradiol  (VIVELLE -DOT) 0.1 MG/24HR patch Place 1 patch (0.1 mg total) onto the skin  2 (two) times a week. 24 patch 3   Estradiol  (YUVAFEM ) 10 MCG TABS vaginal tablet Insert one tablet every day by vaginal route for 14 days then one tab twice weekly thereafter. 18 tablet 3   fluorouracil  (EFUDEX ) 5 % cream Apply 1 application liberally to affected area twice a day ; apply to lower legs in the fall or winter 40 g 2   folic acid  (FOLVITE ) 1 MG tablet Take 1 tablet (1 mg total) by mouth daily. 90 tablet 3   folic acid  (FOLVITE ) 1 MG tablet Take 1 tablet (1 mg total) by mouth daily. 90 tablet 3   meloxicam  (MOBIC ) 7.5 MG tablet Take 1 tablet (7.5 mg total) by mouth daily. 30 tablet 0   Naltrexone -buPROPion  HCl ER 8-90 MG TB12 Take 1 tablet by mouth daily for 1 week, then 1 tablet twice a day for 1 week, then 2 tablets in morning and 1 tablet every evening for 1 week, and then 2 tablets twice daily. 80 tablet 0   progesterone  (PROMETRIUM ) 100 MG capsule Take 1 capsule (100 mg total) by mouth at bedtime. 90 capsule 1   progesterone  (PROMETRIUM ) 100 MG capsule Take 1 capsule (100 mg total) by mouth at bedtime. 90 capsule 3   rosuvastatin  (CRESTOR ) 5 MG tablet Take 1 tablet (5 mg total) by mouth daily. 90 tablet 3  tiZANidine  (ZANAFLEX ) 2 MG tablet Take 1 tablet (2 mg total) by mouth at bedtime. 30 tablet 0   No current facility-administered medications on file prior to visit.    Past Surgical History:  Procedure Laterality Date   CESAREAN SECTION     DILATION AND EVACUATION  04/23/2011   Procedure: DILATATION AND EVACUATION (D&E);  Surgeon: Marshel Skeeters. Fogleman;  Location: [facility];  Service: Gynecology;  Laterality: N/A;  Ultrasound Guidance   laser iridotomy Bilateral May 2015    Allergies  Allergen Reactions   Belviq [Lorcaserin Hcl]     Crazy dreams    BP 127/67   Ht 5\' 7"  (1.702 m)   Wt 160 lb (72.6 kg)   BMI 25.06 kg/m       No data to display              No data to display              Objective:  Physical Exam:  Gen: NAD, comfortable in exam  room  Low back exam: Inspection: No obvious deformity, scoliosis, or ecchymoses.  Palpation: TTP of bony processes near L3. Mild TTP along paraspinal muscles. No TTP SI joint or greater trochanter. ROM: Limited forward flexion and extension due to pain. No limitation with lateral bending or twisting. Full ROM with hip flexion. Strength: 5/5 strength with hip flexion, knee extension/flexion, ankle dorsiflexion/plantar flexion. Normal heel/toe walking. Neuro/Vasc: Sensation intact distally. Special Tests: Positive straight leg raise on the right. Positive slump test with reproduced radicular pain on the right. Negative FABER.   Assessment & Plan:  Ms. [name] is a 48yo F presenting for 3-day history of acute low back pain.  1. Acute radicular low back pain: Acute injury to low back 3-days ago with lifting a mattress. Limited mobility with significant low back pain radiating to the posterior mid-thigh. No urinary incontinence, saddle anesthesia, or leg weakness. TTP along bony processes near L3 with positive straight leg raise on the RT and positive slump test on exam. No lower extremity weakness. Findings consistent with disc herniation. -Ordered X-ray of low back given bony process tenderness -Ordered Meloxicam  15mg  every day and Baclofen 10mg  TID as needed. -Advised Tylenol  as needed for pain relief and continued activity as tolerated -Provided strengthening/stretch exercises for low back pain. Advised holding off on exercises for a few days. -F/u in 1-2 weeks or sooner if worsening or no improvement in symptoms  Unknown [name] - [facility]

## 2023-12-18 NOTE — Patient Instructions (Signed)
 You have a disc herniation of your low back. Get x-rays after you leave today with the bony tenderness you have. Ok to take tylenol  for baseline pain relief (1-2 extra strength tabs 3x/day) Start meloxicam  15mg  daily with food for pain and inflammation. Baclofen as needed for muscle spasms (no driving on this medicine if it makes you sleepy). Stay as active as possible. Physical therapy has been shown to be helpful as well. Strengthening of low back muscles, abdominal musculature are key for long term pain relief - wait a few days before doing these stretches/exercises though. If not improving, will consider further imaging (MRI). Follow up with me in 1-2 weeks but call sooner if you're struggling.

## 2023-12-19 ENCOUNTER — Ambulatory Visit: Admitting: Sports Medicine

## 2023-12-21 ENCOUNTER — Ambulatory Visit: Admitting: Family Medicine

## 2024-03-07 ENCOUNTER — Other Ambulatory Visit: Payer: Self-pay | Admitting: Physician Assistant

## 2024-03-07 ENCOUNTER — Other Ambulatory Visit: Payer: Self-pay

## 2024-03-07 ENCOUNTER — Other Ambulatory Visit (HOSPITAL_COMMUNITY): Payer: Self-pay

## 2024-03-07 MED ORDER — ROSUVASTATIN CALCIUM 5 MG PO TABS
5.0000 mg | ORAL_TABLET | Freq: Every day | ORAL | 3 refills | Status: AC
  Filled 2024-03-07: qty 90, 90d supply, fill #0
  Filled 2024-06-04: qty 90, 90d supply, fill #1
  Filled 2024-09-02: qty 90, 90d supply, fill #2

## 2024-03-08 ENCOUNTER — Other Ambulatory Visit (HOSPITAL_COMMUNITY): Payer: Self-pay

## 2024-03-08 ENCOUNTER — Encounter: Payer: Self-pay | Admitting: Podiatry

## 2024-03-08 ENCOUNTER — Ambulatory Visit: Admitting: Podiatry

## 2024-03-08 DIAGNOSIS — L600 Ingrowing nail: Secondary | ICD-10-CM | POA: Diagnosis not present

## 2024-03-08 DIAGNOSIS — M79671 Pain in right foot: Secondary | ICD-10-CM | POA: Diagnosis not present

## 2024-03-08 MED ORDER — CEPHALEXIN 500 MG PO CAPS
500.0000 mg | ORAL_CAPSULE | Freq: Two times a day (BID) | ORAL | 0 refills | Status: AC
  Filled 2024-03-08: qty 14, 7d supply, fill #0

## 2024-03-08 NOTE — Patient Instructions (Signed)

## 2024-03-08 NOTE — Progress Notes (Signed)
 Patient complains of painful ingrown lateral border hallux nail right.. Patient denies fevers, chills, nausea, vomiting.  Has had some clear drainage some purulence at time.  Some redness along the nail  Objective:  Vitals: Reviewed  General: Well developed, nourished, in no acute distress, alert and oriented x3   Vascular: DP pulse 2/4 bilateral. PT pulse 2/4 bilateral.  Mild edema hallux right.  Capillary refill time immediate  Dermatology: Erythema, edema, incurvated nail border lateral border hallux right serous with serous drainage . Tenderness present with palpation. Normal skin tone and texture feet with normal hair growth.  Neurological: Grossly intact. Normal reflexes.   Musculoskeletal: Tenderness with palpation of the distal hallux right. No tenderness or painful ROM at IPJ.  Diagnosis: 1.  Pain foot right 2.  Ingrown nail lateral border hallux nail right  Plan: -New patient office visit for evaluation and management level 3.  Modifier 25. -discussed etiology and treatment of ingrown nails. Discussed surgical vs conservative treatment. -Consent signed for appropriate matrixectomy affected nail(s). -Rx: Keflex  500 mg 2 p.o. twice daily for 7 days  Procedure(s):   - Matrixectomy(s) lateral border hallux nail right: Toe anesthetized with 3cc 2:1 mixture 2% Lidocaine  with epinephrine: Sodium Bicarbonate. Surgical site prepped. Digital tourniquet applied.  Avulsion of the border. performed. Matrixecomy performed with three 30 second applications of phenol to nail matrix. Site irrigated with alcohol.  Tourniquet released with good vascularity noticed in digit.  Applied triple antibiotic to nailbed and applied gauze and Coban dressing. - Written and oral postoperative instructions given.  -Return for post-op 2 weeks.  JINNY Prentice Binder, DPM

## 2024-03-11 ENCOUNTER — Other Ambulatory Visit (HOSPITAL_COMMUNITY): Payer: Self-pay

## 2024-03-14 ENCOUNTER — Telehealth: Payer: Self-pay | Admitting: Podiatry

## 2024-03-14 NOTE — Telephone Encounter (Signed)
 Called and relayed message to patient. Told her to call if she had any more issues. She did note that their seems to be bruising at site but is going to give it a week or so to see if theres improvement. Thanks!

## 2024-03-14 NOTE — Telephone Encounter (Signed)
 Patient called stating her toe is still numb from the procedure done on 8/1. She is not scheduled for any follow ups. Please advise, thanks!

## 2024-04-02 ENCOUNTER — Ambulatory Visit (INDEPENDENT_AMBULATORY_CARE_PROVIDER_SITE_OTHER): Admitting: Physician Assistant

## 2024-04-02 ENCOUNTER — Encounter: Payer: Self-pay | Admitting: Physician Assistant

## 2024-04-02 VITALS — BP 103/67 | HR 67 | Ht 67.0 in | Wt 163.0 lb

## 2024-04-02 DIAGNOSIS — Z1211 Encounter for screening for malignant neoplasm of colon: Secondary | ICD-10-CM

## 2024-04-02 DIAGNOSIS — Z79890 Hormone replacement therapy (postmenopausal): Secondary | ICD-10-CM | POA: Diagnosis not present

## 2024-04-02 DIAGNOSIS — R140 Abdominal distension (gaseous): Secondary | ICD-10-CM | POA: Diagnosis not present

## 2024-04-02 DIAGNOSIS — R109 Unspecified abdominal pain: Secondary | ICD-10-CM | POA: Insufficient documentation

## 2024-04-02 DIAGNOSIS — E785 Hyperlipidemia, unspecified: Secondary | ICD-10-CM | POA: Diagnosis not present

## 2024-04-02 DIAGNOSIS — E663 Overweight: Secondary | ICD-10-CM

## 2024-04-02 DIAGNOSIS — Z0000 Encounter for general adult medical examination without abnormal findings: Secondary | ICD-10-CM

## 2024-04-02 DIAGNOSIS — Z131 Encounter for screening for diabetes mellitus: Secondary | ICD-10-CM

## 2024-04-02 DIAGNOSIS — E7841 Elevated Lipoprotein(a): Secondary | ICD-10-CM

## 2024-04-02 NOTE — Patient Instructions (Signed)
 Encouraged to start probiotic and/or miralax as needed to have full bowel movements  Health Maintenance, Female Adopting a healthy lifestyle and getting preventive care are important in promoting health and wellness. Ask your health care provider about: The right schedule for you to have regular tests and exams. Things you can do on your own to prevent diseases and keep yourself healthy. What should I know about diet, weight, and exercise? Eat a healthy diet  Eat a diet that includes plenty of vegetables, fruits, low-fat dairy products, and lean protein. Do not eat a lot of foods that are high in solid fats, added sugars, or sodium. Maintain a healthy weight Body mass index (BMI) is used to identify weight problems. It estimates body fat based on height and weight. Your health care provider can help determine your BMI and help you achieve or maintain a healthy weight. Get regular exercise Get regular exercise. This is one of the most important things you can do for your health. Most adults should: Exercise for at least 150 minutes each week. The exercise should increase your heart rate and make you sweat (moderate-intensity exercise). Do strengthening exercises at least twice a week. This is in addition to the moderate-intensity exercise. Spend less time sitting. Even light physical activity can be beneficial. Watch cholesterol and blood lipids Have your blood tested for lipids and cholesterol at 50 years of age, then have this test every 5 years. Have your cholesterol levels checked more often if: Your lipid or cholesterol levels are high. You are older than 50 years of age. You are at high risk for heart disease. What should I know about cancer screening? Depending on your health history and family history, you may need to have cancer screening at various ages. This may include screening for: Breast cancer. Cervical cancer. Colorectal cancer. Skin cancer. Lung cancer. What should I  know about heart disease, diabetes, and high blood pressure? Blood pressure and heart disease High blood pressure causes heart disease and increases the risk of stroke. This is more likely to develop in people who have high blood pressure readings or are overweight. Have your blood pressure checked: Every 3-5 years if you are 39-57 years of age. Every year if you are 69 years old or older. Diabetes Have regular diabetes screenings. This checks your fasting blood sugar level. Have the screening done: Once every three years after age 45 if you are at a normal weight and have a low risk for diabetes. More often and at a younger age if you are overweight or have a high risk for diabetes. What should I know about preventing infection? Hepatitis B If you have a higher risk for hepatitis B, you should be screened for this virus. Talk with your health care provider to find out if you are at risk for hepatitis B infection. Hepatitis C Testing is recommended for: Everyone born from [birth year] through 1967. Anyone with known risk factors for hepatitis C. Sexually transmitted infections (STIs) Get screened for STIs, including gonorrhea and chlamydia, if: You are sexually active and are younger than 50 years of age. You are older than 50 years of age and your health care provider tells you that you are at risk for this type of infection. Your sexual activity has changed since you were last screened, and you are at increased risk for chlamydia or gonorrhea. Ask your health care provider if you are at risk. Ask your health care provider about whether you are at high risk for  HIV. Your health care provider may recommend a prescription medicine to help prevent HIV infection. If you choose to take medicine to prevent HIV, you should first get tested for HIV. You should then be tested every 3 months for as long as you are taking the medicine. Pregnancy If you are about to stop having your period (premenopausal) and  you may become pregnant, seek counseling before you get pregnant. Take 400 to 800 micrograms (mcg) of folic acid  every day if you become pregnant. Ask for birth control (contraception) if you want to prevent pregnancy. Osteoporosis and menopause Osteoporosis is a disease in which the bones lose minerals and strength with aging. This can result in bone fractures. If you are 29 years old or older, or if you are at risk for osteoporosis and fractures, ask your health care provider if you should: Be screened for bone loss. Take a calcium  or vitamin D  supplement to lower your risk of fractures. Be given hormone replacement therapy (HRT) to treat symptoms of menopause. Follow these instructions at home: Alcohol use Do not drink alcohol if: Your health care provider tells you not to drink. You are pregnant, may be pregnant, or are planning to become pregnant. If you drink alcohol: Limit how much you have to: 0-1 drink a day. Know how much alcohol is in your drink. In the [place] one drink equals one 12 oz bottle of beer (355 mL), one 5 oz glass of wine (148 mL), or one 1 oz glass of hard liquor (44 mL). Lifestyle Do not use any products that contain nicotine or tobacco. These products include cigarettes, chewing tobacco, and vaping devices, such as e-cigarettes. If you need help quitting, ask your health care provider. Do not use street drugs. Do not share needles. Ask your health care provider for help if you need support or information about quitting drugs. General instructions Schedule regular health, dental, and eye exams. Stay current with your vaccines. Tell your health care provider if: You often feel depressed. You have ever been abused or do not feel safe at home. Summary Adopting a healthy lifestyle and getting preventive care are important in promoting health and wellness. Follow your health care provider's instructions about healthy diet, exercising, and getting tested or screened  for diseases. Follow your health care provider's instructions on monitoring your cholesterol and blood pressure. This information is not intended to replace advice given to you by your health care provider. Make sure you discuss any questions you have with your health care provider. Document Revised: 12/14/2020 Document Reviewed: 12/14/2020 Elsevier Patient Education  2024 [employer].

## 2024-04-02 NOTE — Progress Notes (Unsigned)
 Complete physical exam  Patient: [name]   DOB: October 04, 1975   48 y.o. Female  MRN: [id number]  Subjective:    Chief Complaint  Patient presents with  . Annual Exam    [name] is a 48 y.o. female who presents today for a complete physical exam. She reports consuming a {diet types:17450} diet. {types:19826} She generally feels {DESC; WELL/FAIRLY WELL/POORLY:18703}. She reports sleeping {DESC; WELL/FAIRLY WELL/POORLY:18703}. She {does/does not:200015} have additional problems to discuss today.    Most recent fall risk assessment:    01/27/2023   10:27 AM  Fall Risk   Falls in the past year? 0  Number falls in past yr: 0  Injury with Fall? 0  Risk for fall due to : No Fall Risks  Follow up Falls evaluation completed     Most recent depression screenings:    01/27/2023   10:27 AM 01/26/2022    9:07 AM  PHQ 2/9 Scores  PHQ - 2 Score 0 0    {VISON DENTAL STD PSA (Optional):27386}  {History (Optional):23778}  Patient Care Team: Makya Phillis L, PA-C as PCP - General (Family Medicine)   Outpatient Medications Prior to Visit  Medication Sig  . Biotin  1 MG CAPS Take 1 capsule by mouth daily.  . cholecalciferol  (VITAMIN D3) 25 MCG (1000 UNIT) tablet Take 1 tablet (1,000 Units total) by mouth daily.  . estradiol  (ESTRACE ) 0.1 MG/GM vaginal cream place a blueberry sized drop of cream into vagina nighlty for 2 weeks then twice weekly.  . estradiol  (MINIVELLE ) 0.1 MG/24HR patch Place 1 patch (0.1 mg total) onto the skin 2 (two) times a week.  . estradiol  (VIVELLE -DOT) 0.1 MG/24HR patch Place 1 patch (0.1 mg total) onto the skin 2 (two) times a week.  . estradiol  (VIVELLE -DOT) 0.1 MG/24HR patch Place 1 patch (0.1 mg total) onto the skin 2 (two) times a week.  . folic acid  (FOLVITE ) 1 MG tablet Take 1 tablet (1 mg total) by mouth daily.  . folic acid  (FOLVITE ) 1 MG tablet Take 1 tablet (1 mg total) by mouth daily.  . meloxicam  (MOBIC ) 15 MG tablet Take 1 tablet (15 mg  total) by mouth daily.  . progesterone  (PROMETRIUM ) 100 MG capsule Take 1 capsule (100 mg total) by mouth at bedtime.  . progesterone  (PROMETRIUM ) 100 MG capsule Take 1 capsule (100 mg total) by mouth at bedtime.  . rosuvastatin  (CRESTOR ) 5 MG tablet Take 1 tablet (5 mg total) by mouth daily.  . tiZANidine  (ZANAFLEX ) 2 MG tablet Take 1 tablet (2 mg total) by mouth at bedtime.  . Alum & Mag Hydroxide-Simeth (MAG-AL PLUS PO) Take by mouth. (Patient not taking: Reported on 04/02/2024)  . baclofen  (LIORESAL ) 10 MG tablet Take 1 tablet (10 mg total) by mouth 3 (three) times daily as needed for muscle spasms. (Patient not taking: Reported on 04/02/2024)  . clobetasol  ointment (TEMOVATE ) 0.05 % Apply a thin layer topically to affected area(s) 2 (two) times daily. (Patient not taking: Reported on 04/02/2024)  . Estradiol  (YUVAFEM ) 10 MCG TABS vaginal tablet Insert one tablet every day by vaginal route for 14 days then one tab twice weekly thereafter. (Patient not taking: Reported on 04/02/2024)  . fluorouracil  (EFUDEX ) 5 % cream Apply 1 application liberally to affected area twice a day ; apply to lower legs in the fall or winter  . Naltrexone -buPROPion  HCl ER 8-90 MG TB12 Take 1 tablet by mouth daily for 1 week, then 1 tablet twice a day for 1  week, then 2 tablets in morning and 1 tablet every evening for 1 week, and then 2 tablets twice daily.   No facility-administered medications prior to visit.    ROS        Objective:     BP 103/67   Pulse 67   Ht 5' 7 (1.702 m)   Wt 163 lb (73.9 kg)   SpO2 97%   BMI 25.53 kg/m  {Vitals History (Optional):23777}  Physical Exam   No results found for any visits on 04/02/24. {Show previous labs (optional):23779}    Assessment & Plan:    Routine Health Maintenance and Physical Exam  Immunization History  Administered Date(s) Administered  . Influenza,inj,Quad PF,6+ Mos 05/22/2020  . Influenza-Unspecified 05/17/2014, 05/15/2015, 05/22/2018,  05/17/2021  . PFIZER(Purple Top)SARS-COV-2 Vaccination 08/27/2019, 09/17/2019, 05/08/2020  . Tdap 01/27/2023    Health Maintenance  Topic Date Due  . Hepatitis B Vaccines 19-59 Average Risk (1 of 3 - 19+ 3-dose series) Never done  . Zoster Vaccines- Shingrix (1 of 2) Never done  . COVID-19 Vaccine (4 - 2024-25 season) 04/09/2023  . Pneumococcal Vaccine: 50+ Years (1 of 1 - PCV) Never done  . Fecal DNA (Cologuard)  02/05/2024  . INFLUENZA VACCINE  03/08/2024  . MAMMOGRAM  10/05/2024  . Cervical Cancer Screening (HPV/Pap Cotest)  01/26/2025  . DTaP/Tdap/Td (2 - Td or Tdap) 01/26/2033  . Hepatitis C Screening  Completed  . HIV Screening  Completed  . HPV VACCINES  Aged Out  . Meningococcal B Vaccine  Aged Out    Discussed health benefits of physical activity, and encouraged her to engage in regular exercise appropriate for her age and condition.  Problem List Items Addressed This Visit   None Visit Diagnoses       Routine physical examination    -  Primary      No follow-ups on file.     Katalia Choma, PA-C

## 2024-04-03 ENCOUNTER — Ambulatory Visit: Payer: Self-pay | Admitting: Physician Assistant

## 2024-04-03 LAB — CBC WITH DIFFERENTIAL/PLATELET
Basophils Absolute: 0.1 x10E3/uL (ref 0.0–0.2)
Basos: 1 %
EOS (ABSOLUTE): 0.2 x10E3/uL (ref 0.0–0.4)
Eos: 4 %
Hematocrit: 39.8 % (ref 34.0–46.6)
Hemoglobin: 13.3 g/dL (ref 11.1–15.9)
Immature Grans (Abs): 0 x10E3/uL (ref 0.0–0.1)
Immature Granulocytes: 0 %
Lymphocytes Absolute: 1.4 x10E3/uL (ref 0.7–3.1)
Lymphs: 28 %
MCH: 31.1 pg (ref 26.6–33.0)
MCHC: 33.4 g/dL (ref 31.5–35.7)
MCV: 93 fL (ref 79–97)
Monocytes Absolute: 0.4 x10E3/uL (ref 0.1–0.9)
Monocytes: 9 %
Neutrophils Absolute: 2.9 x10E3/uL (ref 1.4–7.0)
Neutrophils: 58 %
Platelets: 189 x10E3/uL (ref 150–450)
RBC: 4.28 x10E6/uL (ref 3.77–5.28)
RDW: 12.6 % (ref 11.7–15.4)
WBC: 4.9 x10E3/uL (ref 3.4–10.8)

## 2024-04-03 LAB — LIPID PANEL
Chol/HDL Ratio: 2 ratio (ref 0.0–4.4)
Cholesterol, Total: 135 mg/dL (ref 100–199)
HDL: 66 mg/dL (ref >39)
LDL Chol Calc (NIH): 60 mg/dL (ref 0–99)
Triglycerides: 33 mg/dL (ref 0–149)
VLDL Cholesterol Cal: 9 mg/dL (ref 5–40)

## 2024-04-03 LAB — CMP14+EGFR
ALT: 16 IU/L (ref 0–32)
AST: 17 IU/L (ref 0–40)
Albumin: 4.1 g/dL (ref 3.9–4.9)
Alkaline Phosphatase: 45 IU/L (ref 44–121)
BUN/Creatinine Ratio: 22 (ref 9–23)
BUN: 15 mg/dL (ref 6–24)
Bilirubin Total: 0.5 mg/dL (ref 0.0–1.2)
CO2: 22 mmol/L (ref 20–29)
Calcium: 9.3 mg/dL (ref 8.7–10.2)
Chloride: 106 mmol/L (ref 96–106)
Creatinine, Ser: 0.68 mg/dL (ref 0.57–1.00)
Globulin, Total: 2 g/dL (ref 1.5–4.5)
Glucose: 99 mg/dL (ref 70–99)
Potassium: 4.2 mmol/L (ref 3.5–5.2)
Sodium: 145 mmol/L — ABNORMAL HIGH (ref 134–144)
Total Protein: 6.1 g/dL (ref 6.0–8.5)
eGFR: 106 mL/min/1.73 (ref >59)

## 2024-04-03 LAB — ESTRADIOL: Estradiol: 52.9 pg/mL

## 2024-04-03 LAB — TSH: TSH: 2 u[IU]/mL (ref 0.450–4.500)

## 2024-04-03 LAB — B12 AND FOLATE PANEL
Folate: 17.9 ng/mL (ref >3.0)
Vitamin B-12: 524 pg/mL (ref 232–1245)

## 2024-04-03 LAB — PROGESTERONE: Progesterone: 5.3 ng/mL

## 2024-04-03 LAB — VITAMIN D 25 HYDROXY (VIT D DEFICIENCY, FRACTURES): Vit D, 25-Hydroxy: 76.8 ng/mL (ref 30.0–100.0)

## 2024-04-03 MED ORDER — AMBULATORY NON FORMULARY MEDICATION: Status: AC

## 2024-04-03 NOTE — Progress Notes (Signed)
 Francesa,   Vitamin D  looks great.  B12 and folate looks good.  Thyroid  normal Estradiol  and progesterone  are stable Cholesterol looks GREAT! Kidney, liver, glucose look great.  Sodium just a little elevated. Suggest recheck in a month or so.

## 2024-04-14 DIAGNOSIS — Z1211 Encounter for screening for malignant neoplasm of colon: Secondary | ICD-10-CM | POA: Diagnosis not present

## 2024-04-19 LAB — COLOGUARD: COLOGUARD: NEGATIVE

## 2024-04-22 NOTE — Progress Notes (Signed)
 Negative cologuard. Repeat screening in 3 years or get colonoscopy.

## 2024-05-23 ENCOUNTER — Other Ambulatory Visit (HOSPITAL_COMMUNITY): Payer: Self-pay

## 2024-05-23 DIAGNOSIS — N951 Menopausal and female climacteric states: Secondary | ICD-10-CM | POA: Diagnosis not present

## 2024-05-23 DIAGNOSIS — Z1331 Encounter for screening for depression: Secondary | ICD-10-CM | POA: Diagnosis not present

## 2024-05-23 DIAGNOSIS — Z01411 Encounter for gynecological examination (general) (routine) with abnormal findings: Secondary | ICD-10-CM | POA: Diagnosis not present

## 2024-05-23 MED ORDER — PROGESTERONE MICRONIZED 100 MG PO CAPS
100.0000 mg | ORAL_CAPSULE | Freq: Every day | ORAL | 3 refills | Status: AC
  Filled 2024-05-23: qty 90, 90d supply, fill #0
  Filled 2024-09-02: qty 90, 90d supply, fill #1

## 2024-05-23 MED ORDER — ESTRADIOL 0.1 MG/24HR TD PTTW
1.0000 | MEDICATED_PATCH | TRANSDERMAL | 3 refills | Status: AC
  Filled 2024-05-23: qty 24, 84d supply, fill #0
  Filled 2024-07-16 – 2024-09-02 (×2): qty 24, 84d supply, fill #1

## 2024-05-23 MED ORDER — ESTRADIOL 0.01 % VA CREA
TOPICAL_CREAM | VAGINAL | 1 refills | Status: AC
  Filled 2024-05-23: qty 42.5, 90d supply, fill #0

## 2024-07-16 ENCOUNTER — Other Ambulatory Visit (HOSPITAL_COMMUNITY): Payer: Self-pay
